# Patient Record
Sex: Female | Born: 1955 | Race: Black or African American | Hispanic: No | Smoking: Former smoker
Health system: Southern US, Community
[De-identification: ages and names within clinical notes are randomized; demographics above are authoritative.]

## PROBLEM LIST (undated history)

## (undated) DIAGNOSIS — T7840XA Allergy, unspecified, initial encounter: Secondary | ICD-10-CM

## (undated) DIAGNOSIS — I10 Essential (primary) hypertension: Secondary | ICD-10-CM

## (undated) HISTORY — DX: Allergy, unspecified, initial encounter: T78.40XA

## (undated) HISTORY — PX: OTHER SURGICAL HISTORY: SHX169

## (undated) HISTORY — DX: Essential (primary) hypertension: I10

---

## 1999-01-09 ENCOUNTER — Encounter: Payer: Self-pay | Admitting: Gastroenterology

## 1999-01-09 ENCOUNTER — Ambulatory Visit (HOSPITAL_COMMUNITY): Admission: RE | Admit: 1999-01-09 | Discharge: 1999-01-09 | Payer: Self-pay | Admitting: Gastroenterology

## 1999-03-16 ENCOUNTER — Ambulatory Visit (HOSPITAL_COMMUNITY): Admission: RE | Admit: 1999-03-16 | Discharge: 1999-03-16 | Payer: Self-pay | Admitting: Internal Medicine

## 1999-03-17 ENCOUNTER — Encounter: Payer: Self-pay | Admitting: Internal Medicine

## 2000-12-31 ENCOUNTER — Ambulatory Visit (HOSPITAL_COMMUNITY): Admission: RE | Admit: 2000-12-31 | Discharge: 2000-12-31 | Payer: Self-pay | Admitting: Urology

## 2000-12-31 ENCOUNTER — Encounter: Payer: Self-pay | Admitting: Urology

## 2001-02-14 ENCOUNTER — Other Ambulatory Visit: Admission: RE | Admit: 2001-02-14 | Discharge: 2001-02-14 | Payer: Self-pay | Admitting: Obstetrics and Gynecology

## 2002-02-16 ENCOUNTER — Other Ambulatory Visit: Admission: RE | Admit: 2002-02-16 | Discharge: 2002-02-16 | Payer: Self-pay | Admitting: Gynecology

## 2003-01-18 ENCOUNTER — Encounter: Payer: Self-pay | Admitting: Emergency Medicine

## 2003-01-18 ENCOUNTER — Emergency Department (HOSPITAL_COMMUNITY): Admission: EM | Admit: 2003-01-18 | Discharge: 2003-01-18 | Payer: Self-pay | Admitting: Emergency Medicine

## 2005-10-29 HISTORY — PX: ABDOMINAL HYSTERECTOMY: SHX81

## 2006-09-01 ENCOUNTER — Emergency Department (HOSPITAL_COMMUNITY): Admission: EM | Admit: 2006-09-01 | Discharge: 2006-09-01 | Payer: Self-pay | Admitting: Emergency Medicine

## 2015-02-22 ENCOUNTER — Ambulatory Visit (INDEPENDENT_AMBULATORY_CARE_PROVIDER_SITE_OTHER): Payer: BLUE CROSS/BLUE SHIELD

## 2015-02-22 ENCOUNTER — Ambulatory Visit (INDEPENDENT_AMBULATORY_CARE_PROVIDER_SITE_OTHER): Payer: BLUE CROSS/BLUE SHIELD | Admitting: Physician Assistant

## 2015-02-22 VITALS — BP 180/100 | HR 87 | Temp 98.3°F | Resp 18 | Ht 65.5 in | Wt 186.6 lb

## 2015-02-22 DIAGNOSIS — R062 Wheezing: Secondary | ICD-10-CM | POA: Insufficient documentation

## 2015-02-22 DIAGNOSIS — B182 Chronic viral hepatitis C: Secondary | ICD-10-CM | POA: Insufficient documentation

## 2015-02-22 DIAGNOSIS — J209 Acute bronchitis, unspecified: Secondary | ICD-10-CM

## 2015-02-22 DIAGNOSIS — R0981 Nasal congestion: Secondary | ICD-10-CM

## 2015-02-22 DIAGNOSIS — R05 Cough: Secondary | ICD-10-CM

## 2015-02-22 DIAGNOSIS — R058 Other specified cough: Secondary | ICD-10-CM

## 2015-02-22 DIAGNOSIS — I10 Essential (primary) hypertension: Secondary | ICD-10-CM | POA: Insufficient documentation

## 2015-02-22 LAB — POCT CBC
Granulocyte percent: 46.6 %G (ref 37–80)
HCT, POC: 48.5 % — AB (ref 37.7–47.9)
Hemoglobin: 15.9 g/dL (ref 12.2–16.2)
Lymph, poc: 2.7 (ref 0.6–3.4)
MCH, POC: 28.8 pg (ref 27–31.2)
MCHC: 32.7 g/dL (ref 31.8–35.4)
MCV: 88 fL (ref 80–97)
MID (cbc): 0.5 (ref 0–0.9)
MPV: 8.9 fL (ref 0–99.8)
POC Granulocyte: 2.7 (ref 2–6.9)
POC LYMPH PERCENT: 45.5 %L (ref 10–50)
POC MID %: 7.9 %M (ref 0–12)
Platelet Count, POC: 204 10*3/uL (ref 142–424)
RBC: 5.51 M/uL — AB (ref 4.04–5.48)
RDW, POC: 14.2 %
WBC: 5.9 10*3/uL (ref 4.6–10.2)

## 2015-02-22 LAB — COMPREHENSIVE METABOLIC PANEL
ALT: 35 U/L (ref 0–35)
AST: 32 U/L (ref 0–37)
Albumin: 4.1 g/dL (ref 3.5–5.2)
Alkaline Phosphatase: 64 U/L (ref 39–117)
BUN: 11 mg/dL (ref 6–23)
CO2: 26 mEq/L (ref 19–32)
Calcium: 8.8 mg/dL (ref 8.4–10.5)
Chloride: 109 mEq/L (ref 96–112)
Creat: 0.74 mg/dL (ref 0.50–1.10)
Glucose, Bld: 107 mg/dL — ABNORMAL HIGH (ref 70–99)
Potassium: 3.8 mEq/L (ref 3.5–5.3)
Sodium: 145 mEq/L (ref 135–145)
Total Bilirubin: 0.6 mg/dL (ref 0.2–1.2)
Total Protein: 7.9 g/dL (ref 6.0–8.3)

## 2015-02-22 LAB — TSH: TSH: 1.49 u[IU]/mL (ref 0.350–4.500)

## 2015-02-22 MED ORDER — AZITHROMYCIN 250 MG PO TABS: ORAL_TABLET | ORAL | Status: DC

## 2015-02-22 MED ORDER — BECLOMETHASONE DIPROPIONATE 40 MCG/ACT IN AERS: 1.0000 | INHALATION_SPRAY | Freq: Two times a day (BID) | RESPIRATORY_TRACT | Status: DC

## 2015-02-22 MED ORDER — IPRATROPIUM BROMIDE 0.03 % NA SOLN: 2.0000 | Freq: Two times a day (BID) | NASAL | Status: DC

## 2015-02-22 MED ORDER — LISINOPRIL-HYDROCHLOROTHIAZIDE 10-12.5 MG PO TABS
1.0000 | ORAL_TABLET | Freq: Every day | ORAL | Status: DC
Start: 2015-02-22 — End: 2015-04-26

## 2015-02-22 MED ORDER — IPRATROPIUM BROMIDE 0.02 % IN SOLN
0.5000 mg | Freq: Once | RESPIRATORY_TRACT | Status: AC
  Administered 2015-02-22: 0.5 mg via RESPIRATORY_TRACT

## 2015-02-22 MED ORDER — ALBUTEROL SULFATE (2.5 MG/3ML) 0.083% IN NEBU
2.5000 mg | INHALATION_SOLUTION | Freq: Once | RESPIRATORY_TRACT | Status: AC
  Administered 2015-02-22: 2.5 mg via RESPIRATORY_TRACT

## 2015-02-22 MED ORDER — ALBUTEROL SULFATE HFA 108 (90 BASE) MCG/ACT IN AERS: 2.0000 | INHALATION_SPRAY | RESPIRATORY_TRACT | Status: DC | PRN

## 2015-02-22 NOTE — Patient Instructions (Signed)
Use qvar inhaler twice a day for next 1 month at least. May continue if helping. Rinse mouth each time after use. Use albuterol prn wheezing. atrovent for nasal congestion. Take BP medication daily in the mornings. Start back up with walking most days of the week. Return to see me in 2 months.

## 2015-02-22 NOTE — Progress Notes (Signed)
Subjective:    Patient ID: Jacqueline Thompson, female    DOB: 1956-05-02, 59 y.o.   MRN: [id number]  HPI  This is a 59 year old female who is presenting with cough and wheezing x 3 weeks. Cough is productive. Cough is less productive than initially. Pt reports she has been wheezing even before the cough began. Wheezing has increased since the cough began. Been wheezing for 2 years. Wheezing does not bother her - she is able to walk for exercise without any problems. She feels congested as well but no nasal discharge. Denies sinus pressure. No history of environmental allergies. No history of asthma. History of light tobacco use but quit 10 years ago. Not taking anything for current symptoms. No history of heartburn.  Blood pressure is elevated today to 180/100. She states " my BP has always been high". Duration of HTN for 10 years. She has been prescribed lisinopril in the past but does not take regularly - Takes once a month. She does not have a PCP currently. She denies CP, SOB, palpitations, headache, dizziness, N/V, LE edema.  Has a history of hep C.  Was seeing a doctor in [place] years ago and was prescribed injections to use at home but she never started. Wants to see someone again.  Walks 4-5 days a week in the mornings for 1.5 hours. Wants to lose weight.  Review of Systems  Constitutional: Negative for fever and chills.  HENT: Positive for congestion. Negative for ear pain, sinus pressure and sore throat.   Eyes: Negative for redness.  Respiratory: Positive for cough and wheezing. Negative for shortness of breath.   Cardiovascular: Negative for chest pain, palpitations and leg swelling.  Gastrointestinal: Negative for nausea, vomiting and abdominal pain.  Skin: Negative for rash.  Allergic/Immunologic: Negative for environmental allergies.  Neurological: Negative for dizziness, weakness, numbness and headaches.  Hematological: Negative for adenopathy.  Psychiatric/Behavioral:  Negative for sleep disturbance.   There are no active problems to display for this patient.  Prior to Admission medications   Not on File   No Known Allergies  Patient's social and family history were reviewed.     Objective:   Physical Exam  Constitutional: She is oriented to person, place, and time. She appears well-developed and well-nourished. No distress.  HENT:  Head: Normocephalic and atraumatic.  Right Ear: Hearing, external ear and ear canal normal. Tympanic membrane is retracted.  Left Ear: Hearing, external ear and ear canal normal. Tympanic membrane is retracted.  Nose: Nose normal.  Mouth/Throat: Uvula is midline, oropharynx is clear and moist and mucous membranes are normal.  Eyes: Conjunctivae and lids are normal. Right eye exhibits no discharge. Left eye exhibits no discharge. No scleral icterus.  Neck: Carotid bruit is not present.  Cardiovascular: Normal rate, regular rhythm, normal heart sounds and intact distal pulses.   No murmur heard. Pulses:      Carotid pulses are 3+ on the right side, and 3+ on the left side.      Radial pulses are 3+ on the right side, and 3+ on the left side.  Pulmonary/Chest: Effort normal. No respiratory distress. She has wheezes (expiratory). She has no rhonchi. She has no rales.  Musculoskeletal: Normal range of motion.  Lymphadenopathy:       Head (right side): No submental, no submandibular and no tonsillar adenopathy present.       Head (left side): No submental, no submandibular and no tonsillar adenopathy present.    She  has no cervical adenopathy.  Neurological: She is alert and oriented to person, place, and time.  Skin: Skin is warm, dry and intact. No lesion and no rash noted.  No LE edema  Psychiatric: She has a normal mood and affect. Her speech is normal and behavior is normal. Thought content normal.   BP 180/100 mmHg  Pulse 87  Temp(Src) 98.3 F (36.8 C) (Oral)  Resp 18  Ht 5' 5.5" (1.664 m)  Wt 186 lb 9.6 oz  (84.641 kg)  BMI 30.57 kg/m2  SpO2 96%  [facility] reading (PRIMARY) by  Dr. Ouida Sills: negative  Results for orders placed or performed in visit on 02/22/15  POCT CBC  Result Value Ref Range   WBC 5.9 4.6 - 10.2 K/uL   Lymph, poc 2.7 0.6 - 3.4   POC LYMPH PERCENT 45.5 10 - 50 %L   MID (cbc) 0.5 0 - 0.9   POC MID % 7.9 0 - 12 %M   POC Granulocyte 2.7 2 - 6.9   Granulocyte percent 46.6 37 - 80 %G   RBC 5.51 (A) 4.04 - 5.48 M/uL   Hemoglobin 15.9 12.2 - 16.2 g/dL   HCT, POC 48.5 (A) 37.7 - 47.9 %   MCV 88.0 80 - 97 fL   MCH, POC 28.8 27 - 31.2 pg   MCHC 32.7 31.8 - 35.4 g/dL   RDW, POC 14.2 %   Platelet Count, POC 204 142 - 424 K/uL   MPV 8.9 0 - 99.8 fL   Peak flow 320 before and after neb treatment. No change in wheezing on exam.    Assessment & Plan:  1. Wheezing 2. Productive cough 3. Nasal congestion 4. Acute bronchitis CXR negative. There was no change in peak flow before and after DuoNeb treatment. Gave prescription for albuterol and Qvar. Patient will use Qvar twice a day for next 2 months to see if it makes any difference in her wheezing.Z-Pak for acute bronchitis. Atrovent nasal spray for nasal congestion. She will return in 2 months for followup on her blood pressure. Will do PFTs at that time.  - DG Chest 2 View; Future - ipratropium (ATROVENT) nebulizer solution 0.5 mg; Take 2.5 mLs (0.5 mg total) by nebulization once. - albuterol (PROVENTIL) (2.5 MG/3ML) 0.083% nebulizer solution 2.5 mg; Take 3 mLs (2.5 mg total) by nebulization once. - albuterol (PROVENTIL HFA;VENTOLIN HFA) 108 (90 BASE) MCG/ACT inhaler; Inhale 2 puffs into the lungs every 4 (four) hours as needed for wheezing or shortness of breath (cough, shortness of breath or wheezing.).  Dispense: 1 Inhaler; Refill: 12 - beclomethasone (QVAR) 40 MCG/ACT inhaler; Inhale 1 puff into the lungs 2 (two) times daily.  Dispense: 1 Inhaler; Refill: 12 - azithromycin (ZITHROMAX) 250 MG tablet; Take 2 tabs PO x 1 dose, then 1  tab PO QD x 4 days  Dispense: 6 tablet; Refill: 0 - ipratropium (ATROVENT) 0.03 % nasal spray; Place 2 sprays into both nostrils 2 (two) times daily.  Dispense: 30 mL; Refill: 0  5. Essential hypertension Pt will start taking lisinopril-HCTZ QAM. Discussed the risks of long-term HTN and she understands. CMP, TSH pending. She has a blood pressure monitor at home. She was instructed to take blood pressure 2-3 times a week. She will return for follow up in 2 months. - POCT CBC - Comprehensive metabolic panel - TSH - lisinopril-hydrochlorothiazide (PRINZIDE,ZESTORETIC) 10-12.5 MG per tablet; Take 1 tablet by mouth daily.  Dispense: 90 tablet; Refill: 1  6. Chronic hepatitis C -  Hepatitis C RNA quantitative - AMB referral to hepatitis C clinic    Benjaman Pott. Drenda Freeze, MHS [facility]  02/22/2015

## 2015-02-23 LAB — HEPATITIS C RNA QUANTITATIVE: HCV Quantitative: NOT DETECTED IU/mL (ref <15)

## 2015-02-26 NOTE — Progress Notes (Signed)
  Medical screening examination/treatment/procedure(s) were performed by non-physician practitioner and as supervising physician I was immediately available for consultation/collaboration.     

## 2015-04-26 ENCOUNTER — Ambulatory Visit (INDEPENDENT_AMBULATORY_CARE_PROVIDER_SITE_OTHER): Payer: BLUE CROSS/BLUE SHIELD | Admitting: Physician Assistant

## 2015-04-26 ENCOUNTER — Other Ambulatory Visit: Payer: Self-pay

## 2015-04-26 ENCOUNTER — Encounter: Payer: Self-pay | Admitting: Physician Assistant

## 2015-04-26 VITALS — BP 213/110 | HR 85 | Temp 98.4°F | Resp 16 | Ht 65.5 in | Wt 187.2 lb

## 2015-04-26 DIAGNOSIS — R768 Other specified abnormal immunological findings in serum: Secondary | ICD-10-CM | POA: Insufficient documentation

## 2015-04-26 DIAGNOSIS — I10 Essential (primary) hypertension: Secondary | ICD-10-CM | POA: Diagnosis not present

## 2015-04-26 DIAGNOSIS — Z8619 Personal history of other infectious and parasitic diseases: Secondary | ICD-10-CM

## 2015-04-26 DIAGNOSIS — Z13220 Encounter for screening for lipoid disorders: Secondary | ICD-10-CM

## 2015-04-26 DIAGNOSIS — R062 Wheezing: Secondary | ICD-10-CM | POA: Diagnosis not present

## 2015-04-26 DIAGNOSIS — Z23 Encounter for immunization: Secondary | ICD-10-CM

## 2015-04-26 DIAGNOSIS — J309 Allergic rhinitis, unspecified: Secondary | ICD-10-CM

## 2015-04-26 DIAGNOSIS — Z1231 Encounter for screening mammogram for malignant neoplasm of breast: Secondary | ICD-10-CM

## 2015-04-26 DIAGNOSIS — Z131 Encounter for screening for diabetes mellitus: Secondary | ICD-10-CM | POA: Diagnosis not present

## 2015-04-26 DIAGNOSIS — Z1239 Encounter for other screening for malignant neoplasm of breast: Secondary | ICD-10-CM | POA: Diagnosis not present

## 2015-04-26 LAB — CBC
HCT: 45 % (ref 36.0–46.0)
Hemoglobin: 15.4 g/dL — ABNORMAL HIGH (ref 12.0–15.0)
MCH: 29.8 pg (ref 26.0–34.0)
MCHC: 34.2 g/dL (ref 30.0–36.0)
MCV: 87.2 fL (ref 78.0–100.0)
MPV: 10.6 fL (ref 8.6–12.4)
PLATELETS: 271 10*3/uL (ref 150–400)
RBC: 5.16 MIL/uL — AB (ref 3.87–5.11)
RDW: 15.2 % (ref 11.5–15.5)
WBC: 7.3 10*3/uL (ref 4.0–10.5)

## 2015-04-26 LAB — HEMOGLOBIN A1C
Hgb A1c MFr Bld: 5.8 % — ABNORMAL HIGH (ref <5.7)
MEAN PLASMA GLUCOSE: 120 mg/dL — AB (ref <117)

## 2015-04-26 LAB — LIPID PANEL
CHOL/HDL RATIO: 4.3 ratio
Cholesterol: 186 mg/dL (ref 0–200)
HDL: 43 mg/dL — ABNORMAL LOW (ref >=46)
LDL CALC: 122 mg/dL — AB (ref 0–99)
Triglycerides: 106 mg/dL (ref <150)
VLDL: 21 mg/dL (ref 0–40)

## 2015-04-26 MED ORDER — FLUTICASONE PROPIONATE 50 MCG/ACT NA SUSP: 2.0000 | Freq: Every day | NASAL | Status: DC

## 2015-04-26 MED ORDER — ZOSTER VACCINE LIVE 19400 UNT/0.65ML ~~LOC~~ SOLR: 0.6500 mL | Freq: Once | SUBCUTANEOUS | Status: DC

## 2015-04-26 MED ORDER — LISINOPRIL-HYDROCHLOROTHIAZIDE 20-25 MG PO TABS: 1.0000 | ORAL_TABLET | Freq: Every day | ORAL | Status: DC

## 2015-04-26 MED ORDER — BLOOD PRESSURE MONITOR/ARM DEVI: 1.0000 | Freq: Once | Status: DC

## 2015-04-26 NOTE — Progress Notes (Signed)
[facility] [address], [place] [address] [phone]  Date:  04/26/2015   Name:  Jacqueline Thompson   DOB:  November 26, 1955   MRN:  [id number]  PCP:  No primary care provider on file.    Chief Complaint: Follow-up   History of Present Illness:  This is a 59 y.o. female with PMH HTN who is presenting for follow up HTN. I met patient for the first time 2 months ago. At that time she was being seen for bronchitis but was also noted to have elevated BP to 180/100. She had been on lisinopril in the past but it had been years since she had taken anything. She agreed to start on medication again. She was prescribed lisinopril 10 mg - HCTZ 12.5 mg. She is reporting she did not take her meds today but generally takes everyday. She checked her BP once in the past 2 months with a wrist cuff and was 180s/100s. Today BP 213/110. She is asymptomatic - she denies CP, SOB, palpitations, headache, dizziness, visual disturbance. Pt has not been exercising like she was planning to. She has gained 1 pound since last visit. She has done well with her diet, eating lots of salad and trying to eat less processed foods.  At last visit wheezing was noted on lung exam. Pt reported she had been wheezing for the past 2 years. Radiograph at that time negative. I prescribed qvar to use BID and albuterol prn. Pt states wheezing is getting better. Been compliant with qvar. Uses albuterol inhaler once a week. Using nasal spray as needed for nasal congestion. She is a former smoker - quit 10 years ago.  Last visit pt wanted to be referred to hep C clinic as she had been dx'd with hep c in the past but never had treatment. Lab testing at last visit revealed she had cleared the infection.  Colonoscopy 2010 It has been several years since her last mammogram Last pap 2007 - had hysterectomy for fibroids since then. Still has ovaries. Never had shingles vaccine  Review of Systems:  Review of Systems See  HPI  Patient Active Problem List   Diagnosis Date Noted  . History of hepatitis C 04/26/2015  . Wheezing 02/22/2015  . Essential hypertension 02/22/2015    Prior to Admission medications   Medication Sig Start Date End Date Taking? Authorizing Provider  albuterol (PROVENTIL HFA;VENTOLIN HFA) 108 (90 BASE) MCG/ACT inhaler Inhale 2 puffs into the lungs every 4 (four) hours as needed for wheezing or shortness of breath (cough, shortness of breath or wheezing.). 02/22/15  Yes Bennett Scrape V, PA-C  beclomethasone (QVAR) 40 MCG/ACT inhaler Inhale 1 puff into the lungs 2 (two) times daily. 02/22/15  Yes Bennett Scrape V, PA-C  ipratropium (ATROVENT) 0.03 % nasal spray Place 2 sprays into both nostrils 2 (two) times daily. 02/22/15  Yes Bennett Scrape V, PA-C  lisinopril-hydrochlorothiazide (PRINZIDE,ZESTORETIC) 10-12.5 MG per tablet Take 1 tablet by mouth daily. 02/22/15  Yes Ezekiel Slocumb, PA-C    No Known Allergies  Past Surgical History  Procedure Laterality Date  . Abdominal hysterectomy      History  Substance Use Topics  . Smoking status: Never Smoker   . Smokeless tobacco: Not on file  . Alcohol Use: Not on file    History reviewed. No pertinent family history.  Medication list has been reviewed and updated.  Physical Examination:  Physical Exam  Constitutional: She is oriented to person, place, and  time. She appears well-developed and well-nourished. No distress.  HENT:  Head: Normocephalic and atraumatic.  Right Ear: Hearing normal.  Left Ear: Hearing normal.  Nose: Nose normal.  Eyes: Conjunctivae and lids are normal. Right eye exhibits no discharge. Left eye exhibits no discharge. No scleral icterus.  Neck: Carotid bruit is not present.  Cardiovascular: Normal rate, regular rhythm and normal heart sounds.   No murmur heard. Pulses:      Carotid pulses are 3+ on the right side, and 3+ on the left side.      Radial pulses are 3+ on the right side, and 3+ on the left side.   Pulses bounding  Pulmonary/Chest: Effort normal and breath sounds normal. No respiratory distress. She has no wheezes. She has no rhonchi. She has no rales.  Musculoskeletal: Normal range of motion.  Lymphadenopathy:       Head (right side): No submental, no submandibular and no tonsillar adenopathy present.       Head (left side): No submental, no submandibular and no tonsillar adenopathy present.    She has no cervical adenopathy.  Neurological: She is alert and oriented to person, place, and time. Gait normal.  Skin: Skin is warm, dry and intact. No lesion and no rash noted.  Psychiatric: She has a normal mood and affect. Her speech is normal and behavior is normal. Thought content normal.   BP 213/110 mmHg  Pulse 85  Temp(Src) 98.4 F (36.9 C) (Oral)  Resp 16  Ht 5' 5.5" (1.664 m)  Wt 187 lb 3.2 oz (84.913 kg)  BMI 30.67 kg/m2  SpO2 98%  BP recheck same as above.  Assessment and Plan:  1. Essential hypertension BP very elevated to 213/110. Pt did not take medications today. Doubled dose of lisinopril and HCTZ. She will pick up today and start taking. Sent new BP monitor and cuff to pharmacy. She is going to start taking BP once a day. She will send readings to me in 2-3 weeks and we will determine if more changes need to be made.  - CBC - lisinopril-hydrochlorothiazide (PRINZIDE,ZESTORETIC) 20-25 MG per tablet; Take 1 tablet by mouth daily.  Dispense: 90 tablet; Refill: 1 - Blood Pressure Monitoring (BLOOD PRESSURE MONITOR/ARM) DEVI; 1 Device by Does not apply route once.  Dispense: 1 Device; Refill: 0  2. Screening for diabetes mellitus - Hemoglobin A1c  3. Lipid screening - Lipid panel  4. Breast cancer screening - MM Digital Screening; Future  5. Need for shingles vaccine - zoster vaccine live, PF, (ZOSTAVAX) 03833 UNT/0.65ML injection; Inject 19,400 Units into the skin once.  Dispense: 0.65 mL; Refill: 0  6. History of hepatitis C Naturally cleared infection per  lab testing 2 months ago.  7. Allergic rhinitis, unspecified allergic rhinitis type Stop atrovent, start flonase QD. - fluticasone (FLONASE) 50 MCG/ACT nasal spray; Place 2 sprays into both nostrils daily.  Dispense: 16 g; Refill: 12  8. Wheezing Much improved on lung exam. Continue BID qvar and prn albuterol.   Benjaman Pott Drenda Freeze, MHS [facility]  04/27/2015

## 2015-04-26 NOTE — Patient Instructions (Signed)
Start using flonase nasal spray once a day. This should help prevent nasal congestion. Continue with twice a day qvar inhaler and as needed albuterol inhaler. I will call you with the results of your lab tests today. Start taking new blood pressure pill once a day in the mornings. Buy new BP monitor with arm cuff and take BP once a day. In 3-4 weeks, send me a mychart message with your readings so we can determine if you need any changes. You will get a phone call to make appointment for mammogram. Take prescription for shingles vaccine to pharmacy and get it there. Return for follow up in 3 months.

## 2015-04-27 ENCOUNTER — Other Ambulatory Visit: Payer: Self-pay

## 2015-08-02 ENCOUNTER — Ambulatory Visit (INDEPENDENT_AMBULATORY_CARE_PROVIDER_SITE_OTHER): Payer: BLUE CROSS/BLUE SHIELD | Admitting: Physician Assistant

## 2015-08-02 ENCOUNTER — Encounter: Payer: Self-pay | Admitting: Physician Assistant

## 2015-08-02 VITALS — BP 180/114 | HR 81 | Temp 98.3°F | Resp 16 | Wt 184.0 lb

## 2015-08-02 DIAGNOSIS — I10 Essential (primary) hypertension: Secondary | ICD-10-CM

## 2015-08-02 DIAGNOSIS — Z23 Encounter for immunization: Secondary | ICD-10-CM

## 2015-08-02 DIAGNOSIS — Z1239 Encounter for other screening for malignant neoplasm of breast: Secondary | ICD-10-CM | POA: Diagnosis not present

## 2015-08-02 MED ORDER — AMLODIPINE BESYLATE 5 MG PO TABS: ORAL_TABLET | ORAL | Status: DC

## 2015-08-02 MED ORDER — BLOOD PRESSURE MONITOR/ARM DEVI: 1.0000 | Freq: Once | Status: DC

## 2015-08-02 NOTE — Progress Notes (Signed)
[facility] [address] [place] [address] [phone]  Date:  08/02/2015   Name:  Jacqueline Thompson   DOB:  [birth year]   MRN:  [id number]  PCP:  No primary care provider on file.    Chief Complaint: Hypertension   History of Present Illness:  This is a 59 y.o. female with PMH HTN who is presenting for follow up HTN. I last saw pt 6/28 -- at that time her BP was very elevated to 213/110 but was asymptomatic. I doubled her lisinopril and HCTZ dose. She was to contact me by mychart 2 weeks later with BP readings. She never did this and I was unable to contact her by phone. She is here now stating she is feeling well. She checked her BP once in the past 3 months with a wrist cuff and was 180s/100s. She thinks her wrist cuff is not working well. Last visit I send an arm cuff and monitor to her pharmacy but she states she never picked up. She states she has not been exercising. She states "I'm lazy". She has not been watching the salt in her diet. She admits to eating a lot of french fries and states "my husband is heavy handed with the salt". She denies CP, SOB, headache, dizziness, blurred vision, LE edema. She states she takes her medicine every day.  Last visit she was given rx for zostavax for the 2nd time - did not get. Order mammogram - referrals was unable to reach as phone numbers listed are not correct.  Review of Systems:  Review of Systems See HPI  Patient Active Problem List   Diagnosis Date Noted  . History of hepatitis C 04/26/2015  . Wheezing 02/22/2015  . Essential hypertension 02/22/2015    Prior to Admission medications   Medication Sig Start Date End Date Taking? Authorizing Provider  albuterol (PROVENTIL HFA;VENTOLIN HFA) 108 (90 BASE) MCG/ACT inhaler Inhale 2 puffs into the lungs every 4 (four) hours as needed for wheezing or shortness of breath (cough, shortness of breath or wheezing.). 02/22/15  Yes Bennett Scrape V, PA-C  beclomethasone (QVAR)  40 MCG/ACT inhaler Inhale 1 puff into the lungs 2 (two) times daily. 02/22/15  Yes Bennett Scrape V, PA-C  Blood Pressure Monitoring (BLOOD PRESSURE MONITOR/ARM) DEVI 1 Device by Does not apply route once. 04/26/15  Yes Bennett Scrape V, PA-C  fluticasone (FLONASE) 50 MCG/ACT nasal spray Place 2 sprays into both nostrils daily. 04/26/15  Yes Bennett Scrape V, PA-C  lisinopril-hydrochlorothiazide (PRINZIDE,ZESTORETIC) 20-25 MG per tablet Take 1 tablet by mouth daily. 04/26/15  Yes Bennett Scrape V, PA-C  zoster vaccine live, PF, (ZOSTAVAX) 81448 UNT/0.65ML injection Inject 19,400 Units into the skin once. 04/26/15  Yes Bennett Scrape V, PA-C  zoster vaccine live, PF, (ZOSTAVAX) 18563 UNT/0.65ML injection Inject 19,400 Units into the skin once. 04/26/15  Yes Ezekiel Slocumb, PA-C    Not on File  Past Surgical History  Procedure Laterality Date  . Abdominal hysterectomy      Social History  Substance Use Topics  . Smoking status: Never Smoker   . Smokeless tobacco: None  . Alcohol Use: None    History reviewed. No pertinent family history.  Medication list has been reviewed and updated.  Physical Examination:  Physical Exam  Constitutional: She is oriented to person, place, and time. She appears well-developed and well-nourished. No distress.  HENT:  Head: Normocephalic and atraumatic.  Right Ear: Hearing normal.  Left Ear: Hearing  normal.  Nose: Nose normal.  Eyes: Conjunctivae and lids are normal. Right eye exhibits no discharge. Left eye exhibits no discharge. No scleral icterus.  Neck: Trachea normal. Carotid bruit is not present. No thyromegaly present.  Cardiovascular: Normal rate, regular rhythm and normal heart sounds.   No murmur heard. Pulmonary/Chest: Effort normal and breath sounds normal. No respiratory distress. She has no wheezes. She has no rhonchi. She has no rales.  Musculoskeletal: Normal range of motion.  Lymphadenopathy:       Head (right side): No submental, no submandibular and  no tonsillar adenopathy present.       Head (left side): No submental, no submandibular and no tonsillar adenopathy present.    She has no cervical adenopathy.  Neurological: She is alert and oriented to person, place, and time.  Skin: Skin is warm, dry and intact. No lesion and no rash noted.  Psychiatric: She has a normal mood and affect. Her speech is normal and behavior is normal. Thought content normal.   BP 180/114 mmHg  Pulse 81  Temp(Src) 98.3 F (36.8 C) (Oral)  Resp 16  Wt 184 lb (83.462 kg)  Assessment and Plan:  1. Essential hypertension Pt's BP is just as high as when she was not taking anything for BP. Question compliance with meds. At this time she does not seem committed to lifestyle changes to help lower her BP. We discussed the importance of regular exercise and limiting salt in diet. Will add amlodipine to regimen. She will take 5 mg QD x 1 week then increase to 10 mg QD thereafter. Return in 4 weeks for follow up. - amLODipine (NORVASC) 5 MG tablet; Take 1 tab po QD for 1 week, then increase to 2 tab po QD thereafter.  Dispense: 60 tablet; Refill: 0 - Blood Pressure Monitoring (BLOOD PRESSURE MONITOR/ARM) DEVI; 1 Device by Does not apply route once.  Dispense: 1 Device; Refill: 0  2. Needs flu shot - Flu Vaccine QUAD 36+ mos IM  3. Breast cancer screening Updated phone numbers in system. Again ordered mammogram. - MM Digital Screening; Future   Benjaman Pott. Drenda Freeze, MHS [facility]  08/04/2015

## 2015-08-02 NOTE — Patient Instructions (Addendum)
Watch the salt in your diet, no more than 2.5 mg per day    Watch the white foods in your diet -- rice, potatoes, pasta, bread, sugary beverages, sweets    Go to the bathroom one extra time a day at work for exercise.    Try to start walking more outside of work.    Return to see me in 4 weeks.    You will get a phone call to make appt for mammogram  Why follow it? Research shows. . Those who follow the Mediterranean diet have a reduced risk of heart disease  . The diet is associated with a reduced incidence of Parkinson's and Alzheimer's diseases . People following the diet may have longer life expectancies and lower rates of chronic diseases  . The Dietary Guidelines for Americans recommends the Mediterranean diet as an eating plan to promote health and prevent disease  What Is the Mediterranean Diet?  . Healthy eating plan based on typical foods and recipes of Mediterranean-style cooking . The diet is primarily a plant based diet; these foods should make up a majority of meals   Starches - Plant based foods should make up a majority of meals - They are an important sources of vitamins, minerals, energy, antioxidants, and fiber - Choose whole grains, foods high in fiber and minimally processed items  - Typical grain sources include wheat, oats, barley, corn, brown rice, bulgar, farro, millet, polenta, couscous  - Various types of beans include chickpeas, lentils, fava beans, black beans, white beans   Fruits  Veggies - Large quantities of antioxidant rich fruits & veggies; 6 or more servings  - Vegetables can be eaten raw or lightly drizzled with oil and cooked  - Vegetables common to the traditional Mediterranean Diet include: artichokes, arugula, beets, broccoli, brussel sprouts, cabbage, carrots, celery, collard greens, cucumbers, eggplant, kale, leeks, lemons, lettuce, mushrooms, okra, onions, peas, peppers, potatoes, pumpkin, radishes, rutabaga, shallots, spinach, sweet potatoes,  turnips, zucchini - Fruits common to the Mediterranean Diet include: apples, apricots, avocados, cherries, clementines, dates, figs, grapefruits, grapes, melons, nectarines, oranges, peaches, pears, pomegranates, strawberries, tangerines  Fats - Replace butter and margarine with healthy oils, such as olive oil, canola oil, and tahini  - Limit nuts to no more than a handful a day  - Nuts include walnuts, almonds, pecans, pistachios, pine nuts  - Limit or avoid candied, honey roasted or heavily salted nuts - Olives are central to the [employer] - can be eaten whole or used in a variety of dishes   Meats Protein - Limiting red meat: no more than a few times a month - When eating red meat: choose lean cuts and keep the portion to the size of deck of cards - Eggs: approx. 0 to 4 times a week  - Fish and lean poultry: at least 2 a week  - Healthy protein sources include, chicken, [place], lean beef, lamb - Increase intake of seafood such as tuna, salmon, trout, mackerel, shrimp, scallops - Avoid or limit high fat processed meats such as sausage and bacon  Dairy - Include moderate amounts of low fat dairy products  - Focus on healthy dairy such as fat free yogurt, skim milk, low or reduced fat cheese - Limit dairy products higher in fat such as whole or 2% milk, cheese, ice cream  Alcohol - Moderate amounts of red wine is ok  - No more than 5 oz daily for women (all ages) and men older than age 65  -  No more than 10 oz of wine daily for men younger than 40  Other - Limit sweets and other desserts  - Use herbs and spices instead of salt to flavor foods  - Herbs and spices common to the traditional Mediterranean Diet include: basil, bay leaves, chives, cloves, cumin, fennel, garlic, lavender, marjoram, mint, oregano, parsley, pepper, rosemary, sage, savory, sumac, tarragon, thyme   It's not just a diet, it's a lifestyle:  . The Mediterranean diet includes lifestyle factors typical of those in  the region  . Foods, drinks and meals are best eaten with others and savored . Daily physical activity is important for overall good health . This could be strenuous exercise like running and aerobics . This could also be more leisurely activities such as walking, housework, yard-work, or taking the stairs . Moderation is the key; a balanced and healthy diet accommodates most foods and drinks . Consider portion sizes and frequency of consumption of certain foods   Meal Ideas & Options:  . Breakfast:  o Whole wheat toast or whole wheat English muffins with peanut butter & hard boiled egg o Steel cut oats topped with apples & cinnamon and skim milk  o Fresh fruit: banana, strawberries, melon, berries, peaches  o Smoothies: strawberries, bananas, greek yogurt, peanut butter o Low fat greek yogurt with blueberries and granola  o Egg white omelet with spinach and mushrooms o Breakfast couscous: whole wheat couscous, apricots, skim milk, cranberries  . Sandwiches:  o Hummus and grilled vegetables (peppers, zucchini, squash) on whole wheat bread   o Grilled chicken on whole wheat pita with lettuce, tomatoes, cucumbers or tzatziki  o Tuna salad on whole wheat bread: tuna salad made with greek yogurt, olives, red peppers, capers, green onions o Garlic rosemary lamb pita: lamb sauted with garlic, rosemary, salt & pepper; add lettuce, cucumber, greek yogurt to pita - flavor with lemon juice and black pepper  . Seafood:  o Mediterranean grilled salmon, seasoned with garlic, basil, parsley, lemon juice and black pepper o Shrimp, lemon, and spinach whole-grain pasta salad made with low fat greek yogurt  o Seared scallops with lemon orzo  o Seared tuna steaks seasoned salt, pepper, coriander topped with tomato mixture of olives, tomatoes, olive oil, minced garlic, parsley, green onions and cappers  . Meats:  o Herbed greek chicken salad with kalamata olives, cucumber, feta  o Red bell peppers stuffed  with spinach, bulgur, lean ground beef (or lentils) & topped with feta   o Kebabs: skewers of chicken, tomatoes, onions, zucchini, squash  o [place] burgers: made with red onions, mint, dill, lemon juice, feta cheese topped with roasted red peppers . Vegetarian o Cucumber salad: cucumbers, artichoke hearts, celery, red onion, feta cheese, tossed in olive oil & lemon juice  o Hummus and whole grain pita points with a greek salad (lettuce, tomato, feta, olives, cucumbers, red onion) o Lentil soup with celery, carrots made with vegetable broth, garlic, salt and pepper  o Tabouli salad: parsley, bulgur, mint, scallions, cucumbers, tomato, radishes, lemon juice, olive oil, salt and pepper.

## 2015-08-08 ENCOUNTER — Telehealth: Payer: Self-pay | Admitting: Family Medicine

## 2015-08-08 NOTE — Telephone Encounter (Signed)
LMOM OF PATIENT NEW TIME ON 09/06/15 AT 4:00

## 2015-08-09 ENCOUNTER — Other Ambulatory Visit: Payer: Self-pay

## 2015-08-09 DIAGNOSIS — Z1231 Encounter for screening mammogram for malignant neoplasm of breast: Secondary | ICD-10-CM

## 2015-09-06 ENCOUNTER — Encounter: Payer: Self-pay | Admitting: Physician Assistant

## 2015-09-06 ENCOUNTER — Ambulatory Visit (INDEPENDENT_AMBULATORY_CARE_PROVIDER_SITE_OTHER): Payer: BLUE CROSS/BLUE SHIELD | Admitting: Physician Assistant

## 2015-09-06 ENCOUNTER — Other Ambulatory Visit: Payer: Self-pay | Admitting: Physician Assistant

## 2015-09-06 VITALS — BP 130/78 | HR 102 | Temp 98.1°F | Resp 16 | Ht 65.5 in | Wt 180.0 lb

## 2015-09-06 DIAGNOSIS — I10 Essential (primary) hypertension: Secondary | ICD-10-CM | POA: Diagnosis not present

## 2015-09-06 MED ORDER — AMLODIPINE BESYLATE 10 MG PO TABS: 10.0000 mg | ORAL_TABLET | Freq: Every day | ORAL | Status: DC

## 2015-09-06 NOTE — Progress Notes (Signed)
[facility] [address] [place] [address] [phone]  Date:  09/06/2015   Name:  Jacqueline Thompson   DOB:  May 04, 1956   MRN:  [id number]  PCP:  No primary care provider on file.    Chief Complaint: Follow-up   History of Present Illness:  This is a 59 y.o. female with PMH HTN who is presenting for follow up blood pressure. Pt was started on BP medication 6 months ago. BP still uncontrolled after lisinopril 20 mg and HCTZ 25 mg. Last visit 08/02/2015 BP 180/114. Amlodipine was added. She took 5 mg for 1 week and then increased to 10 mg. Today BP on arrival 158/89 and came down to 130/78. She states she feels "lighter". She has been focusing and using stairs at work. She has cut down on red meat and french fries. She is cooking more at home and eating more fish, fruits and vegetables. She has lost 4 pounds in the past month. She bought a BP cuff but has not taken BP yet. She denies CP, SOB, dizziness, palps, leg swelling, vision change. No side effects to amlodipine.  Mammogram scheduled in November.  Review of Systems:  Review of Systems See HPI  Patient Active Problem List   Diagnosis Date Noted  . History of hepatitis C 04/26/2015  . Wheezing 02/22/2015  . Essential hypertension 02/22/2015    Prior to Admission medications   Medication Sig Start Date End Date Taking? Authorizing Provider  albuterol (PROVENTIL HFA;VENTOLIN HFA) 108 (90 BASE) MCG/ACT inhaler Inhale 2 puffs into the lungs every 4 (four) hours as needed for wheezing or shortness of breath (cough, shortness of breath or wheezing.). 02/22/15  Yes Bennett Scrape V, PA-C  amLODipine (NORVASC) 5 MG tablet Take 1 tab po QD for 1 week, then increase to 2 tab po QD thereafter. 08/02/15  Yes Bennett Scrape V, PA-C  beclomethasone (QVAR) 40 MCG/ACT inhaler Inhale 1 puff into the lungs 2 (two) times daily. 02/22/15  Yes Bennett Scrape V, PA-C  fluticasone (FLONASE) 50 MCG/ACT nasal spray Place 2 sprays into both  nostrils daily. 04/26/15  Yes Bennett Scrape V, PA-C  lisinopril-hydrochlorothiazide (PRINZIDE,ZESTORETIC) 20-25 MG per tablet Take 1 tablet by mouth daily. 04/26/15  Yes Ezekiel Slocumb, PA-C           No Known Allergies  Past Surgical History  Procedure Laterality Date  . Abdominal hysterectomy      Social History  Substance Use Topics  . Smoking status: Never Smoker   . Smokeless tobacco: None  . Alcohol Use: None    History reviewed. No pertinent family history.  Medication list has been reviewed and updated.  Physical Examination:  Physical Exam  Constitutional: She is oriented to person, place, and time. She appears well-developed and well-nourished. No distress.  HENT:  Head: Normocephalic and atraumatic.  Right Ear: Hearing normal.  Left Ear: Hearing normal.  Nose: Nose normal.  Eyes: Conjunctivae and lids are normal. Right eye exhibits no discharge. Left eye exhibits no discharge. No scleral icterus.  Cardiovascular: Normal rate, regular rhythm, normal heart sounds and normal pulses.   No murmur heard. Pulmonary/Chest: Effort normal. No respiratory distress. She has no wheezes. She has no rhonchi. She has no rales.  Musculoskeletal: Normal range of motion.  Neurological: She is alert and oriented to person, place, and time.  Skin: Skin is warm, dry and intact. No lesion and no rash noted.  Psychiatric: She has a normal mood and  affect. Her speech is normal and behavior is normal. Thought content normal.    BP 130/78 mmHg  Pulse 102  Temp(Src) 98.1 F (36.7 C) (Oral)  Resp 16  Ht 5' 5.5" (1.664 m)  Wt 180 lb (81.647 kg)  BMI 29.49 kg/m2  Assessment and Plan:  1. Essential hypertension BP is looking much better on the amlodipine. She will continue lisinopril, hctz and amlodipine. Congratulated on weight loss and healthier eating. Advised she take BP at least once a week if not 2-3 times and keep a record. She will return in 3 months for CPE and f/u. - amLODipine  (NORVASC) 10 MG tablet; Take 1 tablet (10 mg total) by mouth daily.  Dispense: 90 tablet; Refill: 3   Leanor Voris V. Drenda Freeze, MHS [facility]  09/06/2015

## 2015-09-06 NOTE — Patient Instructions (Signed)
Keep with exercise and diet. Take your BP at least once a week and keep a record. Return in 3 months for a complete physical.

## 2015-09-20 ENCOUNTER — Ambulatory Visit
Admission: RE | Admit: 2015-09-20 | Discharge: 2015-09-20 | Disposition: A | Discharge status: Home / Self Care | Payer: BLUE CROSS/BLUE SHIELD | Source: Ambulatory Visit | Attending: Physician Assistant | Admitting: Physician Assistant

## 2015-09-20 DIAGNOSIS — Z1231 Encounter for screening mammogram for malignant neoplasm of breast: Secondary | ICD-10-CM

## 2015-10-29 ENCOUNTER — Other Ambulatory Visit: Payer: Self-pay | Admitting: Physician Assistant

## 2015-11-12 ENCOUNTER — Telehealth: Payer: Self-pay | Admitting: Family Medicine

## 2015-11-12 NOTE — Telephone Encounter (Signed)
Left a message for patient to return call to reschedule appointment on 12/06/15.  Jacqueline Thompson is not going to be in clinic that day,.

## 2015-11-12 NOTE — Telephone Encounter (Signed)
lmom to call and reschedule appt with Tawni Pummel

## 2015-12-06 ENCOUNTER — Encounter: Payer: BLUE CROSS/BLUE SHIELD | Admitting: Physician Assistant

## 2015-12-13 ENCOUNTER — Ambulatory Visit (INDEPENDENT_AMBULATORY_CARE_PROVIDER_SITE_OTHER): Payer: BLUE CROSS/BLUE SHIELD | Admitting: Physician Assistant

## 2015-12-13 ENCOUNTER — Encounter: Payer: Self-pay | Admitting: Physician Assistant

## 2015-12-13 VITALS — BP 131/70 | HR 71 | Temp 98.4°F | Resp 16 | Ht 65.5 in | Wt 175.4 lb

## 2015-12-13 DIAGNOSIS — I10 Essential (primary) hypertension: Secondary | ICD-10-CM | POA: Diagnosis not present

## 2015-12-13 DIAGNOSIS — Z1272 Encounter for screening for malignant neoplasm of vagina: Secondary | ICD-10-CM | POA: Diagnosis not present

## 2015-12-13 DIAGNOSIS — Z124 Encounter for screening for malignant neoplasm of cervix: Secondary | ICD-10-CM | POA: Diagnosis not present

## 2015-12-13 DIAGNOSIS — Z131 Encounter for screening for diabetes mellitus: Secondary | ICD-10-CM

## 2015-12-13 DIAGNOSIS — Z0000 Encounter for general adult medical examination without abnormal findings: Secondary | ICD-10-CM | POA: Diagnosis not present

## 2015-12-13 DIAGNOSIS — Z23 Encounter for immunization: Secondary | ICD-10-CM | POA: Diagnosis not present

## 2015-12-13 DIAGNOSIS — E785 Hyperlipidemia, unspecified: Secondary | ICD-10-CM

## 2015-12-13 LAB — CBC
HEMATOCRIT: 44.6 % (ref 36.0–46.0)
HEMOGLOBIN: 15 g/dL (ref 12.0–15.0)
MCH: 29.7 pg (ref 26.0–34.0)
MCHC: 33.6 g/dL (ref 30.0–36.0)
MCV: 88.3 fL (ref 78.0–100.0)
MPV: 10.8 fL (ref 8.6–12.4)
Platelets: 335 10*3/uL (ref 150–400)
RBC: 5.05 MIL/uL (ref 3.87–5.11)
RDW: 13.7 % (ref 11.5–15.5)
WBC: 6.2 10*3/uL (ref 4.0–10.5)

## 2015-12-13 LAB — COMPREHENSIVE METABOLIC PANEL
ALBUMIN: 3.7 g/dL (ref 3.6–5.1)
ALK PHOS: 75 U/L (ref 33–130)
ALT: 19 U/L (ref 6–29)
AST: 21 U/L (ref 10–35)
BILIRUBIN TOTAL: 0.4 mg/dL (ref 0.2–1.2)
BUN: 13 mg/dL (ref 7–25)
CALCIUM: 9.6 mg/dL (ref 8.6–10.4)
CO2: 30 mmol/L (ref 20–31)
Chloride: 102 mmol/L (ref 98–110)
Creat: 0.74 mg/dL (ref 0.50–0.99)
Glucose, Bld: 82 mg/dL (ref 65–99)
Potassium: 3.6 mmol/L (ref 3.5–5.3)
Sodium: 142 mmol/L (ref 135–146)
Total Protein: 7.3 g/dL (ref 6.1–8.1)

## 2015-12-13 LAB — HEMOGLOBIN A1C
Hgb A1c MFr Bld: 5.7 % — ABNORMAL HIGH (ref <5.7)
Mean Plasma Glucose: 117 mg/dL — ABNORMAL HIGH (ref <117)

## 2015-12-13 LAB — LIPID PANEL
CHOLESTEROL: 172 mg/dL (ref 125–200)
HDL: 40 mg/dL — AB (ref >=46)
LDL Cholesterol: 111 mg/dL (ref <130)
TRIGLYCERIDES: 103 mg/dL (ref <150)
Total CHOL/HDL Ratio: 4.3 Ratio (ref <=5.0)
VLDL: 21 mg/dL (ref <30)

## 2015-12-13 MED ORDER — ZOSTER VACCINE LIVE 19400 UNT/0.65ML ~~LOC~~ SOLR: 0.6500 mL | Freq: Once | SUBCUTANEOUS | Status: DC

## 2015-12-13 MED ORDER — LISINOPRIL-HYDROCHLOROTHIAZIDE 20-25 MG PO TABS: ORAL_TABLET | ORAL | Status: DC

## 2015-12-13 NOTE — Progress Notes (Signed)
[facility] [address], [place] [address] [phone]  Date:  12/13/2015   Name:  Jacqueline Thompson   DOB:  03/28/1956   MRN:  [id number]  PCP:  No primary care provider on file.    Chief Complaint: Annual Exam   History of Present Illness:  This is a 60 y.o. female with PMH HTN, hx hep C that spontaneously cleared who is presenting for CPE.  HTN - doing well on current regimen. Lisinopril 20-hctz 25 and amlodipine 5 mg. She is walking stairs at work for exercise. She has lost 10 pounds in past 6 months. Doing well with limiting diet. She is using a salt substitute.  Complaints: no Last pap: can't recall last pap. Doesn't think she has ever had abnormals. She had a laparoscopic procedure done in October of 2007 for menorrhagia. States she had fibroids. She is unsure if she has a uterus or cervix. She states she is pretty sure she has ovaries. Sexual history: sexually active with husband. Does not want STD testing today. Immunizations: flu 10/16, tdap 1/15. Unsure whether she wants zostavax. Dentist: 4 years ago. Eye: last had eye appt 10/16 Fam hx: HTN. CAD and DM in mother, dad with prostate cancer.  Tobacco/alcohol/substance use: no/2 drinks a week/no Mammogram: 09/20/15 Colonoscopy: colonoscopy 2010 Sleeps well, mood is good.   Review of Systems:  Review of Systems  Constitutional: Negative.   HENT: Negative.   Eyes: Negative.   Respiratory: Negative.   Cardiovascular: Negative.   Gastrointestinal: Negative.   Endocrine: Negative.   Genitourinary: Negative.   Musculoskeletal: Negative.   Skin: Negative.   Allergic/Immunologic: Negative.   Neurological: Negative.   Hematological: Negative.   Psychiatric/Behavioral: Negative.     Patient Active Problem List   Diagnosis Date Noted  . History of hepatitis C 04/26/2015  . Wheezing 02/22/2015  . Essential hypertension 02/22/2015    Prior to Admission medications   Medication Sig Start  Date End Date Taking? Authorizing Provider  albuterol (PROVENTIL HFA;VENTOLIN HFA) 108 (90 BASE) MCG/ACT inhaler Inhale 2 puffs into the lungs every 4 (four) hours as needed for wheezing or shortness of breath (cough, shortness of breath or wheezing.). 02/22/15  Yes Bennett Scrape V, PA-C  amLODipine (NORVASC) 10 MG tablet Take 1 tablet (10 mg total) by mouth daily. 09/06/15  Yes Bennett Scrape V, PA-C  beclomethasone (QVAR) 40 MCG/ACT inhaler Inhale 1 puff into the lungs 2 (two) times daily. 02/22/15  Yes Bennett Scrape V, PA-C  fluticasone (FLONASE) 50 MCG/ACT nasal spray Place 2 sprays into both nostrils daily. 04/26/15  Yes Bennett Scrape V, PA-C  lisinopril-hydrochlorothiazide (PRINZIDE,ZESTORETIC) 20-25 MG tablet TAKE 1 TABLET BY MOUTH DAILY 10/30/15  Yes Bennett Scrape V, PA-C  Blood Pressure Monitoring (BLOOD PRESSURE MONITOR/ARM) DEVI 1 Device by Does not apply route once. 08/02/15   Ezekiel Slocumb, PA-C    No Known Allergies  Past Surgical History  Procedure Laterality Date  . Abdominal hysterectomy  2007    Social History  Substance Use Topics  . Smoking status: Former Smoker -- 10 years    Quit date: 11/29/1997  . Smokeless tobacco: None     Comment: smoked a pack per week  . Alcohol Use: None    Family History  Problem Relation Age of Onset  . Heart disease Mother   . Cancer Father     colon,prostate and spine    Medication list has been reviewed and updated.  Physical Examination:  Physical Exam  Constitutional: She is oriented to person, place, and time.  HENT:  Head: Normocephalic and atraumatic.  Right Ear: Hearing, external ear and ear canal normal. Tympanic membrane is retracted.  Left Ear: Hearing, external ear and ear canal normal. Tympanic membrane is retracted.  Nose: Mucosal edema (dried blood on mucosa) present.  Mouth/Throat: Uvula is midline, oropharynx is clear and moist and mucous membranes are normal.  Eyes: Conjunctivae, EOM and lids are normal. Right eye exhibits  no discharge. Left eye exhibits no discharge. No scleral icterus.  Neck: Trachea normal. Carotid bruit is not present. No thyromegaly present.  Cardiovascular: Normal rate, regular rhythm, normal heart sounds, intact distal pulses and normal pulses.   No murmur heard. Pulmonary/Chest: Effort normal and breath sounds normal. She has no wheezes. She has no rhonchi. She has no rales. Right breast exhibits no inverted nipple, no mass, no nipple discharge, no skin change and no tenderness. Left breast exhibits no inverted nipple, no mass, no nipple discharge, no skin change and no tenderness. Breasts are symmetrical.  Scar through left nipple  Abdominal: Soft. Normal appearance and bowel sounds are normal. She exhibits no abdominal bruit. There is no tenderness.  Genitourinary: Vagina normal. There is no lesion on the right labia. There is no lesion on the left labia. Right adnexum displays no tenderness and no fullness. Left adnexum displays no tenderness and no fullness. No vaginal discharge found.  Cervix and uterus absent  Musculoskeletal: Normal range of motion.  Lymphadenopathy:       Head (right side): No submental, no submandibular and no tonsillar adenopathy present.       Head (left side): No submental, no submandibular and no tonsillar adenopathy present.    She has no cervical adenopathy.    She has no axillary adenopathy.       Right: No supraclavicular adenopathy present.       Left: No supraclavicular adenopathy present.  Neurological: She is alert and oriented to person, place, and time. She has normal strength and normal reflexes. No cranial nerve deficit or sensory deficit. Coordination and gait normal.  Skin: Skin is warm, dry and intact. No lesion and no rash noted.  Psychiatric: She has a normal mood and affect. Her speech is normal and behavior is normal. Thought content normal.   BP 131/70 mmHg  Pulse 71  Temp(Src) 98.4 F (36.9 C) (Oral)  Resp 16  Ht 5' 5.5" (1.664 m)   Wt 175 lb 6.4 oz (79.561 kg)  BMI 28.73 kg/m2  SpO2 97%  Assessment and Plan:  1. Annual physical exam Encouraged to get dental appt. Get shingles vaccine. Otherwise up to date on preventative screening. - CBC  2. Screening for vaginal cancer If negative, never needs pap again. - Pap IG, CT/NG w/ reflex HPV when ASC-U  3. Essential hypertension Controlled on current regimen. Continue. Return in 6 months for follow up. - Comprehensive metabolic panel - lisinopril-hydrochlorothiazide (PRINZIDE,ZESTORETIC) 20-25 MG tablet; TAKE 1 TABLET BY MOUTH DAILY  Dispense: 90 tablet; Refill: 1  4. HLD (hyperlipidemia) - Lipid panel  5. Need for shingles vaccine - zoster vaccine live, PF, (ZOSTAVAX) 16109 UNT/0.65ML injection; Inject 19,400 Units into the skin once.  Dispense: 0.65 mL; Refill: 0  6. Screening for diabetes mellitus - Hemoglobin A1c   Benjaman Pott. Drenda Freeze, MHS [facility]  12/13/2015

## 2015-12-13 NOTE — Patient Instructions (Addendum)
Make appt with dentist Get shingles vaccine at [facility] or [facility] Continue with diet and exercise. No need for pap again if pap normal Return in 6 months for follow up.

## 2015-12-15 LAB — PAP IG, CT-NG, RFX HPV ASCU
Chlamydia Probe Amp: NOT DETECTED
GC PROBE AMP: NOT DETECTED

## 2016-01-27 ENCOUNTER — Other Ambulatory Visit: Payer: Self-pay | Admitting: Physician Assistant

## 2016-05-29 ENCOUNTER — Ambulatory Visit: Payer: BLUE CROSS/BLUE SHIELD | Admitting: Physician Assistant

## 2016-07-16 ENCOUNTER — Other Ambulatory Visit: Payer: Self-pay | Admitting: Physician Assistant

## 2016-07-16 DIAGNOSIS — I10 Essential (primary) hypertension: Secondary | ICD-10-CM

## 2016-08-22 ENCOUNTER — Other Ambulatory Visit: Payer: Self-pay | Admitting: Physician Assistant

## 2016-08-22 DIAGNOSIS — J309 Allergic rhinitis, unspecified: Secondary | ICD-10-CM

## 2016-08-24 ENCOUNTER — Other Ambulatory Visit: Payer: Self-pay | Admitting: Physician Assistant

## 2016-08-24 DIAGNOSIS — I10 Essential (primary) hypertension: Secondary | ICD-10-CM

## 2016-09-30 ENCOUNTER — Other Ambulatory Visit: Payer: Self-pay | Admitting: Family Medicine

## 2016-09-30 DIAGNOSIS — I10 Essential (primary) hypertension: Secondary | ICD-10-CM

## 2016-09-30 NOTE — Telephone Encounter (Signed)
Last exam and lab 11/2015 needs ov

## 2016-10-12 ENCOUNTER — Other Ambulatory Visit: Payer: Self-pay | Admitting: Physician Assistant

## 2016-10-12 DIAGNOSIS — I10 Essential (primary) hypertension: Secondary | ICD-10-CM

## 2016-11-24 ENCOUNTER — Other Ambulatory Visit: Payer: Self-pay | Admitting: Urgent Care

## 2016-11-24 DIAGNOSIS — I10 Essential (primary) hypertension: Secondary | ICD-10-CM

## 2016-11-24 NOTE — Telephone Encounter (Signed)
11/2015 last ov and labs

## 2016-12-03 ENCOUNTER — Other Ambulatory Visit: Payer: Self-pay | Admitting: Physician Assistant

## 2016-12-03 DIAGNOSIS — Z1231 Encounter for screening mammogram for malignant neoplasm of breast: Secondary | ICD-10-CM

## 2016-12-14 ENCOUNTER — Ambulatory Visit (INDEPENDENT_AMBULATORY_CARE_PROVIDER_SITE_OTHER): Payer: BLUE CROSS/BLUE SHIELD | Admitting: Physician Assistant

## 2016-12-14 ENCOUNTER — Ambulatory Visit
Admission: RE | Admit: 2016-12-14 | Discharge: 2016-12-14 | Disposition: A | Discharge status: Home / Self Care | Payer: BLUE CROSS/BLUE SHIELD | Source: Ambulatory Visit | Attending: Physician Assistant | Admitting: Physician Assistant

## 2016-12-14 VITALS — BP 162/80 | HR 83 | Temp 98.0°F | Ht 65.5 in | Wt 172.0 lb

## 2016-12-14 DIAGNOSIS — Z01419 Encounter for gynecological examination (general) (routine) without abnormal findings: Secondary | ICD-10-CM | POA: Diagnosis not present

## 2016-12-14 DIAGNOSIS — Z23 Encounter for immunization: Secondary | ICD-10-CM | POA: Diagnosis not present

## 2016-12-14 DIAGNOSIS — Z1329 Encounter for screening for other suspected endocrine disorder: Secondary | ICD-10-CM | POA: Diagnosis not present

## 2016-12-14 DIAGNOSIS — Z1231 Encounter for screening mammogram for malignant neoplasm of breast: Secondary | ICD-10-CM | POA: Diagnosis not present

## 2016-12-14 DIAGNOSIS — Z130 Encounter for screening for diseases of the blood and blood-forming organs and certain disorders involving the immune mechanism: Secondary | ICD-10-CM | POA: Diagnosis not present

## 2016-12-14 DIAGNOSIS — Z13220 Encounter for screening for lipoid disorders: Secondary | ICD-10-CM

## 2016-12-14 DIAGNOSIS — I10 Essential (primary) hypertension: Secondary | ICD-10-CM

## 2016-12-14 DIAGNOSIS — Z114 Encounter for screening for human immunodeficiency virus [HIV]: Secondary | ICD-10-CM | POA: Diagnosis not present

## 2016-12-14 DIAGNOSIS — R768 Other specified abnormal immunological findings in serum: Secondary | ICD-10-CM | POA: Diagnosis not present

## 2016-12-14 DIAGNOSIS — Z1211 Encounter for screening for malignant neoplasm of colon: Secondary | ICD-10-CM | POA: Diagnosis not present

## 2016-12-14 DIAGNOSIS — R062 Wheezing: Secondary | ICD-10-CM

## 2016-12-14 DIAGNOSIS — Z0000 Encounter for general adult medical examination without abnormal findings: Secondary | ICD-10-CM

## 2016-12-14 DIAGNOSIS — J3089 Other allergic rhinitis: Secondary | ICD-10-CM

## 2016-12-14 DIAGNOSIS — R7689 Other specified abnormal immunological findings in serum: Secondary | ICD-10-CM

## 2016-12-14 DIAGNOSIS — Z13228 Encounter for screening for other metabolic disorders: Secondary | ICD-10-CM

## 2016-12-14 MED ORDER — FLUTICASONE PROPIONATE 50 MCG/ACT NA SUSP: 2.0000 | Freq: Every day | NASAL | 1 refills | Status: DC

## 2016-12-14 MED ORDER — AMLODIPINE BESYLATE 10 MG PO TABS: 10.0000 mg | ORAL_TABLET | Freq: Every day | ORAL | 1 refills | Status: DC

## 2016-12-14 MED ORDER — LISINOPRIL-HYDROCHLOROTHIAZIDE 20-25 MG PO TABS: 1.0000 | ORAL_TABLET | Freq: Every day | ORAL | 1 refills | Status: DC

## 2016-12-14 MED ORDER — ALBUTEROL SULFATE HFA 108 (90 BASE) MCG/ACT IN AERS: 2.0000 | INHALATION_SPRAY | RESPIRATORY_TRACT | 0 refills | Status: DC | PRN

## 2016-12-14 NOTE — Progress Notes (Signed)
Jacqueline Thompson  MRN: [id number] DOB: [birth year]  Subjective:  Pt presents to clinic for a CPE.  She does not check her BP at home but takes her medication daily. Wheezing - 1 inhaler has lasted about 2 years  Last dental exam: been a while Last vision exam: wears glasses - Oct 2017   Last mammogram: this am Last colonoscopy: ? - she had polyps - in [place] - over 5 years Vaccinations      Zostavax - refused  Exercise: active at work Diet: eats both healthy food and junk food, drinks mostly water Sleep: good  Patient Active Problem List   Diagnosis Date Noted  . Hepatitis C antibody test positive 04/26/2015  . Wheezing 02/22/2015  . Essential hypertension 02/22/2015    No current outpatient prescriptions on file prior to visit.   No current facility-administered medications on file prior to visit.     No Known Allergies  Social History   Social History  . Marital status: Legally Separated    Spouse name: N/A  . Number of children: N/A  . Years of education: N/A   Occupational History  . distrubution specialist    Social History Main Topics  . Smoking status: Former Smoker    Years: 10.00    Quit date: 11/29/1997  . Smokeless tobacco: Never Used     Comment: smoked a pack per week  . Alcohol use 2.4 oz/week    3 Glasses of wine, 1 Shots of liquor per week     Comment: 2 drinks a week  . Drug use: No  . Sexual activity: Not Asked   Other Topics Concern  . None   Social History Narrative   Marital status: married   Children: none   Lives with: husband           Seatbelt: 100%   Guns in home: no         Exercise - active at work   Sleep - good       Past Surgical History:  Procedure Laterality Date  . ABDOMINAL HYSTERECTOMY  2007    Family History  Problem Relation Age of Onset  . Heart disease Mother   . Cancer Father     colon,prostate and spine  . Stroke Brother     Review of Systems  Constitutional: Negative.   HENT: Negative.    Eyes: Negative.   Respiratory: Negative.   Cardiovascular: Negative.   Gastrointestinal: Negative.   Endocrine: Negative.   Genitourinary: Negative.   Musculoskeletal: Negative.   Skin: Negative.   Allergic/Immunologic: Negative.   Neurological: Negative.   Hematological: Negative.   Psychiatric/Behavioral: Negative.     Objective:  BP (!) 164/84 (BP Location: Right Arm, Patient Position: Sitting, Cuff Size: Small)   Pulse 83   Temp 98 F (36.7 C) (Oral)   Ht 5' 5.5" (1.664 m)   Wt 172 lb (78 kg)   SpO2 96%   BMI 28.19 kg/m   Physical Exam  Constitutional: She is oriented to person, place, and time and well-developed, well-nourished, and in no distress.  HENT:  Head: Normocephalic and atraumatic.  Right Ear: Hearing, tympanic membrane, external ear and ear canal normal.  Left Ear: Hearing, tympanic membrane, external ear and ear canal normal.  Nose: Nose normal.  Mouth/Throat: Uvula is midline, oropharynx is clear and moist and mucous membranes are normal.  Eyes: Conjunctivae and EOM are normal. Pupils are equal, round, and reactive to light.  Neck: Trachea normal  and normal range of motion. Neck supple. No thyroid mass and no thyromegaly present.  Cardiovascular: Normal rate, regular rhythm and normal heart sounds.   No murmur heard. Pulmonary/Chest: Effort normal and breath sounds normal. She has no wheezes. Right breast exhibits no inverted nipple, no mass, no nipple discharge, no skin change and no tenderness. Left breast exhibits no inverted nipple, no mass, no nipple discharge, no skin change and no tenderness. Breasts are symmetrical.  Abdominal: Soft. Bowel sounds are normal. There is no tenderness.  Musculoskeletal: Normal range of motion.       Right lower leg: She exhibits no edema.       Left lower leg: She exhibits no edema.  Lymphadenopathy:    She has no cervical adenopathy.  Neurological: She is alert and oriented to person, place, and time. She has normal  motor skills, normal sensation, normal strength and normal reflexes. Gait normal.  Skin: Skin is warm and dry.  Psychiatric: Mood, memory, affect and judgment normal.  Vitals reviewed.   Visual Acuity Screening   Right eye Left eye Both eyes  Without correction:     With correction: '20/20 20/20 20/20 '    Assessment and Plan :  Annual physical exam  Need for prophylactic vaccination and inoculation against influenza - Plan: Flu Vaccine QUAD 36+ mos IM  Hepatitis C antibody test positive  Encounter for gynecological examination without abnormal finding  Screen for colon cancer - Plan: Ambulatory referral to Gastroenterology  Screening for deficiency anemia - Plan: CBC with Differential/Platelet  Screening for metabolic disorder - Plan: CMP14+EGFR  Screening, lipid - Plan: Lipid panel  Screening for thyroid disorder - Plan: TSH  Screening for HIV (human immunodeficiency virus) - Plan: HIV antibody  Essential hypertension - Plan: lisinopril-hydrochlorothiazide (PRINZIDE,ZESTORETIC) 20-25 MG tablet, amLODipine (NORVASC) 10 MG tablet - slightly elevated today - pt to recheck with me in a month and then her medications will be adjusted at that time if still high - a year ago her BP was great.  Wheezing - Plan: albuterol (PROVENTIL HFA;VENTOLIN HFA) 108 (90 Base) MCG/ACT inhaler  Allergic rhinitis due to other allergic trigger, unspecified chronicity, unspecified seasonality - Plan: fluticasone (FLONASE) 50 MCG/ACT nasal spray  Windell Hummingbird PA-C  [facility] 12/14/2016 9:51 AM

## 2016-12-14 NOTE — Patient Instructions (Addendum)
Keeping You Healthy  Get These Tests  Blood Pressure- Have your blood pressure checked by your healthcare provider at least once a year.  Normal blood pressure is 120/80.  Weight- Have your body mass index (BMI) calculated to screen for obesity.  BMI is a measure of body fat based on height and weight.  You can calculate your own BMI at www.nhlbisupport.com/bmi/  Cholesterol- Have your cholesterol checked every year.  Diabetes- Have your blood sugar checked every year if you have high blood pressure, high cholesterol, a family history of diabetes or if you are overweight.  Pap Test - Have a pap test every 1 to 5 years if you have been sexually active.  If you are older than 65 and recent pap tests have been normal you may not need additional pap tests.  In addition, if you have had a hysterectomy  for benign disease additional pap tests are not necessary.  Mammogram-Yearly mammograms are essential for early detection of breast cancer  Screening for Colon Cancer- Colonoscopy starting at age 50. Screening may begin sooner depending on your family history and other health conditions.  Follow up colonoscopy as directed by your Gastroenterologist.  Screening for Osteoporosis- Screening begins at age 65 with bone density scanning, sooner if you are at higher risk for developing Osteoporosis.  Get these medicines  Calcium with Vitamin D- Your body requires 1200-1500 mg of Calcium a day and 800-1000 IU of Vitamin D a day.  You can only absorb 500 mg of Calcium at a time therefore Calcium must be taken in 2 or 3 separate doses throughout the day.  Hormones- Hormone therapy has been associated with increased risk for certain cancers and heart disease.  Talk to your healthcare provider about if you need relief from menopausal symptoms.  Aspirin- Ask your healthcare provider about taking Aspirin to prevent Heart Disease and Stroke.  Get these Immuniztions  Flu shot- Every fall  Pneumonia shot-  Once after the age of 65; if you are younger ask your healthcare provider if you need a pneumonia shot.  Tetanus- Every ten years.  Zostavax- Once after the age of 60 to prevent shingles.  Take these steps  Don't smoke- Your healthcare provider can help you quit. For tips on how to quit, ask your healthcare provider or go to www.smokefree.gov or call 1-800 QUIT-NOW.  Be physically active- Exercise 5 days a week for a minimum of 30 minutes.  If you are not already physically active, start slow and gradually work up to 30 minutes of moderate physical activity.  Try walking, dancing, bike riding, swimming, etc.  Eat a healthy diet- Eat a variety of healthy foods such as fruits, vegetables, whole grains, low fat milk, low fat cheeses, yogurt, lean meats, chicken, fish, eggs, dried beans, tofu, etc.  For more information go to www.thenutritionsource.org  Dental visit- Brush and floss teeth twice daily; visit your dentist twice a year.  Eye exam- Visit your Optometrist or Ophthalmologist yearly.  Drink alcohol in moderation- Limit alcohol intake to one drink or less a day.  Never drink and drive.  Depression- Your emotional health is as important as your physical health.  If you're feeling down or losing interest in things you normally enjoy, please talk to your healthcare provider.  Seat Belts- can save your life; always wear one  Smoke/Carbon Monoxide detectors- These detectors need to be installed on the appropriate level of your home.  Replace batteries at least once a year.  Violence- If   anyone is threatening or hurting you, please tell your healthcare provider.  Living Will/ Health care power of attorney- Discuss with your healthcare provider and family.    IF you received an x-ray today, you will receive an invoice from [facility]. Please contact [facility] at [phone] with questions or concerns regarding your invoice.   IF you received labwork today, you  will receive an invoice from [facility]. Please contact [facility] at [phone] with questions or concerns regarding your invoice.   Our billing staff will not be able to assist you with questions regarding bills from these companies.  You will be contacted with the lab results as soon as they are available. The fastest way to get your results is to activate your My Chart account. Instructions are located on the last page of this paperwork. If you have not heard from us regarding the results in 2 weeks, please contact this office.     

## 2016-12-15 LAB — CMP14+EGFR
A/G RATIO: 1.3 (ref 1.2–2.2)
ALBUMIN: 4.3 g/dL (ref 3.6–4.8)
ALT: 19 IU/L (ref 0–32)
AST: 19 IU/L (ref 0–40)
Alkaline Phosphatase: 77 IU/L (ref 39–117)
BILIRUBIN TOTAL: 0.5 mg/dL (ref 0.0–1.2)
BUN / CREAT RATIO: 20 (ref 12–28)
BUN: 16 mg/dL (ref 8–27)
CHLORIDE: 102 mmol/L (ref 96–106)
CO2: 26 mmol/L (ref 18–29)
Calcium: 9.5 mg/dL (ref 8.7–10.3)
Creatinine, Ser: 0.8 mg/dL (ref 0.57–1.00)
GFR calc non Af Amer: 79 mL/min/{1.73_m2} (ref >59)
GFR, EST AFRICAN AMERICAN: 91 mL/min/{1.73_m2} (ref >59)
Globulin, Total: 3.2 g/dL (ref 1.5–4.5)
Glucose: 98 mg/dL (ref 65–99)
POTASSIUM: 3.4 mmol/L — AB (ref 3.5–5.2)
Sodium: 144 mmol/L (ref 134–144)
TOTAL PROTEIN: 7.5 g/dL (ref 6.0–8.5)

## 2016-12-15 LAB — CBC WITH DIFFERENTIAL/PLATELET
BASOS: 1 %
Basophils Absolute: 0.1 10*3/uL (ref 0.0–0.2)
EOS (ABSOLUTE): 0.2 10*3/uL (ref 0.0–0.4)
Eos: 4 %
Hematocrit: 46.1 % (ref 34.0–46.6)
Hemoglobin: 15.6 g/dL (ref 11.1–15.9)
IMMATURE GRANS (ABS): 0 10*3/uL (ref 0.0–0.1)
Immature Granulocytes: 0 %
LYMPHS ABS: 3.4 10*3/uL — AB (ref 0.7–3.1)
LYMPHS: 52 %
MCH: 30.4 pg (ref 26.6–33.0)
MCHC: 33.8 g/dL (ref 31.5–35.7)
MCV: 90 fL (ref 79–97)
Monocytes Absolute: 0.4 10*3/uL (ref 0.1–0.9)
Monocytes: 7 %
NEUTROS ABS: 2.3 10*3/uL (ref 1.4–7.0)
Neutrophils: 36 %
PLATELETS: 280 10*3/uL (ref 150–379)
RBC: 5.14 x10E6/uL (ref 3.77–5.28)
RDW: 14.1 % (ref 12.3–15.4)
WBC: 6.3 10*3/uL (ref 3.4–10.8)

## 2016-12-15 LAB — TSH: TSH: 0.742 u[IU]/mL (ref 0.450–4.500)

## 2016-12-15 LAB — LIPID PANEL
Chol/HDL Ratio: 3.9 ratio units (ref 0.0–4.4)
Cholesterol, Total: 185 mg/dL (ref 100–199)
HDL: 48 mg/dL (ref >39)
LDL Calculated: 126 mg/dL — ABNORMAL HIGH (ref 0–99)
Triglycerides: 56 mg/dL (ref 0–149)
VLDL Cholesterol Cal: 11 mg/dL (ref 5–40)

## 2016-12-15 LAB — HIV ANTIBODY (ROUTINE TESTING W REFLEX): HIV SCREEN 4TH GENERATION: NONREACTIVE

## 2016-12-30 ENCOUNTER — Other Ambulatory Visit: Payer: Self-pay | Admitting: Urgent Care

## 2016-12-30 DIAGNOSIS — I10 Essential (primary) hypertension: Secondary | ICD-10-CM

## 2016-12-31 NOTE — Telephone Encounter (Signed)
Please schedule an appt with me within a month for a BP check.

## 2017-01-10 ENCOUNTER — Ambulatory Visit (INDEPENDENT_AMBULATORY_CARE_PROVIDER_SITE_OTHER): Payer: BLUE CROSS/BLUE SHIELD | Admitting: Physician Assistant

## 2017-01-10 DIAGNOSIS — I10 Essential (primary) hypertension: Secondary | ICD-10-CM | POA: Diagnosis not present

## 2017-01-10 MED ORDER — AMLODIPINE BESYLATE 10 MG PO TABS: 10.0000 mg | ORAL_TABLET | Freq: Every day | ORAL | 1 refills | Status: DC

## 2017-01-10 NOTE — Progress Notes (Signed)
     Patient ID: Jacqueline Thompson, female    DOB: Mar 12, 1956, 61 y.o.   MRN: [id number]  PCP: No primary care provider on file.  Chief Complaint  Patient presents with  . Follow-up    1 month    Subjective:   Presents for follow up one glass of wine a night, cut back on fried foods, trying not to eat fired wings but its hard. She goes up and down stairs every day at work which is where she gets her exercise. She ran out of the Amlodipine Monday but she has one waiting at her pharmacy so she will pick it up today. Colonoscopy due, and its in [facility], wants to change it to [place].    Review of Systems As written above   Patient Active Problem List   Diagnosis Date Noted  . Hepatitis C antibody test positive 04/26/2015  . Wheezing 02/22/2015  . Essential hypertension 02/22/2015    Prior to Admission medications   Medication Sig Start Date End Date Taking? Authorizing Provider  albuterol (PROVENTIL HFA;VENTOLIN HFA) 108 (90 Base) MCG/ACT inhaler Inhale 2 puffs into the lungs every 4 (four) hours as needed for wheezing or shortness of breath (cough, shortness of breath or wheezing.). 12/14/16  Yes Mancel Bale, PA-C  amLODipine (NORVASC) 10 MG tablet Take 1 tablet (10 mg total) by mouth daily. 12/14/16  Yes Mancel Bale, PA-C  fluticasone (FLONASE) 50 MCG/ACT nasal spray Place 2 sprays into both nostrils daily. 12/14/16  Yes Mancel Bale, PA-C  lisinopril-hydrochlorothiazide (PRINZIDE,ZESTORETIC) 20-25 MG tablet Take 1 tablet by mouth daily. 12/14/16  Yes Sarah Alleen Borne, PA-C  lisinopril-hydrochlorothiazide (PRINZIDE,ZESTORETIC) 20-25 MG tablet TAKE 1 TABLET BY MOUTH EVERY DAY 12/31/16  Yes Mancel Bale, PA-C   No Known Allergies  Objective:  Physical Exam  Constitutional: She is oriented to person, place, and time. She appears well-developed and well-nourished. She is active.  Blood pressure (!) 154/99, pulse 96, temperature 98.3 F (36.8 C), temperature source Oral, resp.  rate 16, height 5\' 5"  (1.651 m), weight 172 lb (78 kg), SpO2 96 %.  Eyes: Pupils are equal, round, and reactive to light.  Cardiovascular: Normal rate, regular rhythm and normal heart sounds.   Neurological: She is alert and oriented to person, place, and time.  Skin: Skin is warm and dry.  Psychiatric: She has a normal mood and affect. Her behavior is normal. Judgment and thought content normal.    ASCVD risk 28.4% via MD Calc    Assessment & Plan:  1. Essential hypertension - amLODipine (NORVASC) 10 MG tablet; Take 1 tablet (10 mg total) by mouth daily.  Dispense: 90 tablet; Refill: 1

## 2017-01-10 NOTE — Patient Instructions (Addendum)
 GI [phone] Call them for the colonoscopy -- get the name of the GI in [place] and call me and I will add to your release that you signed today.  Please bring your BP cuff to your next visit   IF you received an x-ray today, you will receive an invoice from [facility]. Please contact [facility] at [phone] with questions or concerns regarding your invoice.   IF you received labwork today, you will receive an invoice from [facility]. Please contact [facility] at [phone] with questions or concerns regarding your invoice.   Our billing staff will not be able to assist you with questions regarding bills from these companies.  You will be contacted with the lab results as soon as they are available. The fastest way to get your results is to activate your My Chart account. Instructions are located on the last page of this paperwork. If you have not heard from [place] regarding the results in 2 weeks, please contact this office.

## 2017-01-11 ENCOUNTER — Telehealth: Payer: Self-pay | Admitting: Family Medicine

## 2017-01-11 ENCOUNTER — Encounter: Payer: Self-pay | Admitting: Physician Assistant

## 2017-01-11 ENCOUNTER — Telehealth: Payer: Self-pay | Admitting: Physician Assistant

## 2017-01-11 NOTE — Progress Notes (Signed)
   Jacqueline Thompson  MRN: [id number] DOB: 09-Jun-1956  PCP: No primary care provider on file.  Chief Complaint  Patient presents with  . Follow-up    1 month    Subjective:  Pt presents to clinic for recheck of her HTN.  She has run out of her norvasc about 4 days ago but she has been taking her lisinopril daily.  She heard from [facility] but she erased the message by accident and forgot to call them back.  She is feeling fine otherwise.  She got a BP machine but she is not sure how to use it correctly but she forgot to bring it in today.  Review of Systems  Constitutional: Negative for chills and fever.  Respiratory: Negative for shortness of breath.   Cardiovascular: Negative for chest pain, palpitations and leg swelling.  Neurological: Negative for headaches.    Patient Active Problem List   Diagnosis Date Noted  . Hepatitis C antibody test positive 04/26/2015  . Wheezing 02/22/2015  . Essential hypertension 02/22/2015    Current Outpatient Prescriptions on File Prior to Visit  Medication Sig Dispense Refill  . albuterol (PROVENTIL HFA;VENTOLIN HFA) 108 (90 Base) MCG/ACT inhaler Inhale 2 puffs into the lungs every 4 (four) hours as needed for wheezing or shortness of breath (cough, shortness of breath or wheezing.). 1 Inhaler 0  . fluticasone (FLONASE) 50 MCG/ACT nasal spray Place 2 sprays into both nostrils daily. 48 g 1  . lisinopril-hydrochlorothiazide (PRINZIDE,ZESTORETIC) 20-25 MG tablet Take 1 tablet by mouth daily. 90 tablet 1   No current facility-administered medications on file prior to visit.     No Known Allergies  Pt patients past, family and social history were reviewed and updated.   Objective:  BP (!) 154/99   Pulse 96   Temp 98.3 F (36.8 C) (Oral)   Resp 16   Ht 5\' 5"  (1.651 m)   Wt 172 lb (78 kg)   SpO2 96%   BMI 28.62 kg/m   Physical Exam  Constitutional: She is oriented to person, place, and time and well-developed, well-nourished, and in no  distress.  HENT:  Head: Normocephalic and atraumatic.  Right Ear: Hearing and external ear normal.  Left Ear: Hearing and external ear normal.  Eyes: Conjunctivae are normal.  Neck: Normal range of motion.  Cardiovascular: Normal rate, regular rhythm and normal heart sounds.   No murmur heard. Pulmonary/Chest: Effort normal and breath sounds normal.  Musculoskeletal:       Right lower leg: She exhibits no edema.       Left lower leg: She exhibits no edema.  Neurological: She is alert and oriented to person, place, and time. Gait normal.  Skin: Skin is warm and dry.  Psychiatric: Mood, memory, affect and judgment normal.  Vitals reviewed.   Assessment and Plan :  Essential hypertension - Plan: amLODipine (NORVASC) 10 MG tablet uncontrolled but pt has been out of medications for the last week.  She will continue her medications and recheck with me in a month while on medications - she is to bring her cuff with her.  Windell Hummingbird PA-C  [facility] 01/11/2017 3:27 PM

## 2017-01-11 NOTE — Telephone Encounter (Signed)
Pt calling to let Windell Hummingbird know that the [facility] in [place] so she will make an appointment here in [place] instead of [place] she also filled out a Release form for her records there to be sent here

## 2017-01-11 NOTE — Telephone Encounter (Signed)
Pt calling to give verbal permisson to release her medical records to [place]  Please advise: [phone]

## 2017-01-11 NOTE — Telephone Encounter (Signed)
Pt would like her medical records faxed from [facility] to PCP? She filled out wrong release, we need a verbal auth. Please get if she calls back. VM left for her to do so.

## 2017-01-11 NOTE — Telephone Encounter (Signed)
Done

## 2017-01-18 ENCOUNTER — Telehealth: Payer: Self-pay | Admitting: Family Medicine

## 2017-01-18 NOTE — Telephone Encounter (Signed)
Pt calling with a verbal request of her medical records she filled out wrong paper

## 2017-01-18 NOTE — Telephone Encounter (Signed)
I called pt back and left VM. In need to know where she would like her information sent or if PCP is to receive her medical info. I need to know which dates of service, past 5 years or a specific date.

## 2017-01-22 NOTE — Telephone Encounter (Signed)
I spoke with pt and got her verbal auth to get her medical records from [facility] on [address]. I sent out request.

## 2017-02-06 ENCOUNTER — Other Ambulatory Visit: Payer: Self-pay | Admitting: Physician Assistant

## 2017-02-06 DIAGNOSIS — I10 Essential (primary) hypertension: Secondary | ICD-10-CM

## 2017-02-19 ENCOUNTER — Encounter: Payer: Self-pay | Admitting: Physician Assistant

## 2017-02-19 ENCOUNTER — Ambulatory Visit (INDEPENDENT_AMBULATORY_CARE_PROVIDER_SITE_OTHER): Payer: BLUE CROSS/BLUE SHIELD | Admitting: Physician Assistant

## 2017-02-19 VITALS — BP 159/79 | HR 73 | Temp 98.0°F | Resp 16 | Ht 65.0 in | Wt 174.2 lb

## 2017-02-19 DIAGNOSIS — I10 Essential (primary) hypertension: Secondary | ICD-10-CM | POA: Diagnosis not present

## 2017-02-19 NOTE — Progress Notes (Signed)
Jacqueline Thompson  MRN: [id number] DOB: [birth year]  PCP: Elizabeth Sauer  Chief Complaint  Patient presents with  . Follow-up    blood pressure    Subjective:  Pt presents to clinic for BP recheck.  She is doing well - she has been taking her Norvasc daily - she has not been checking her BP at home but she brought her cuff in today to have it checked.  She feels fine.  Review of Systems  Constitutional: Negative for chills and fever.  Respiratory: Negative for shortness of breath.   Cardiovascular: Negative for chest pain, palpitations and leg swelling.  Neurological: Negative for headaches.    Patient Active Problem List   Diagnosis Date Noted  . Hepatitis C antibody test positive 04/26/2015  . Wheezing 02/22/2015  . Essential hypertension 02/22/2015    Current Outpatient Prescriptions on File Prior to Visit  Medication Sig Dispense Refill  . albuterol (PROVENTIL HFA;VENTOLIN HFA) 108 (90 Base) MCG/ACT inhaler Inhale 2 puffs into the lungs every 4 (four) hours as needed for wheezing or shortness of breath (cough, shortness of breath or wheezing.). 1 Inhaler 0  . amLODipine (NORVASC) 10 MG tablet Take 1 tablet (10 mg total) by mouth daily. 90 tablet 1  . fluticasone (FLONASE) 50 MCG/ACT nasal spray Place 2 sprays into both nostrils daily. 48 g 1  . lisinopril-hydrochlorothiazide (PRINZIDE,ZESTORETIC) 20-25 MG tablet Take 1 tablet by mouth daily. 90 tablet 1  . lisinopril-hydrochlorothiazide (PRINZIDE,ZESTORETIC) 20-25 MG tablet TAKE 1 TABLET BY MOUTH EVERY DAY 30 tablet 2   No current facility-administered medications on file prior to visit.     No Known Allergies  Pt patients past, family and social history were reviewed and updated.   Objective:  BP (!) 159/79 (BP Location: Left Arm, Patient Position: Sitting, Cuff Size: Normal)   Pulse 73   Temp 98 F (36.7 C) (Oral)   Resp 16   Ht 5\' 5"  (1.651 m)   Wt 174 lb 3.2 oz (79 kg)   SpO2 95%   BMI 28.99 kg/m    Physical Exam  Constitutional: She is oriented to person, place, and time and well-developed, well-nourished, and in no distress.  HENT:  Head: Normocephalic and atraumatic.  Right Ear: Hearing and external ear normal.  Left Ear: Hearing and external ear normal.  Eyes: Conjunctivae are normal.  Neck: Normal range of motion.  Cardiovascular: Normal rate, regular rhythm and normal heart sounds.   No murmur heard. Pulmonary/Chest: Effort normal and breath sounds normal.  Musculoskeletal:       Right lower leg: She exhibits no edema.       Left lower leg: She exhibits no edema.  Neurological: She is alert and oriented to person, place, and time. Gait normal.  Skin: Skin is warm and dry.  Psychiatric: Mood, memory, affect and judgment normal.  Vitals reviewed.   Assessment and Plan :   Problem List Items Addressed This Visit      Cardiovascular and Mediastinum   Essential hypertension - Primary    Better control today while on Norvasc - will recheck the patient in 2 months and at that time determine whether she will need to be switched to valsartan for more improved control. She takes her medication in the am and her visit was in the afternoon - ? Switch to chlorthalidone would help with that.  She will continue to check her BP at home but her cuff is not accurate and she will try and return it  for a replacement.          Windell Hummingbird PA-C  [facility] at [facility] 02/19/2017 5:38 PM

## 2017-02-19 NOTE — Assessment & Plan Note (Signed)
Better control today while on Norvasc - will recheck the patient in 2 months and at that time determine whether she will need to be switched to valsartan for more improved control. She takes her medication in the am and her visit was in the afternoon - ? Switch to chlorthalidone would help with that.  She will continue to check her BP at home but her cuff is not accurate and she will try and return it for a replacement.

## 2017-02-19 NOTE — Patient Instructions (Addendum)
  BP machine reading-- 189/109 In office reading -- 142/90    IF you received an x-ray today, you will receive an invoice from [facility]. Please contact [facility] at [phone] with questions or concerns regarding your invoice.   IF you received labwork today, you will receive an invoice from [facility]. Please contact [facility] at [phone] with questions or concerns regarding your invoice.   Our billing staff will not be able to assist you with questions regarding bills from these companies.  You will be contacted with the lab results as soon as they are available. The fastest way to get your results is to activate your My Chart account. Instructions are located on the last page of this paperwork. If you have not heard from [place] regarding the results in 2 weeks, please contact this office.

## 2017-04-23 ENCOUNTER — Ambulatory Visit (INDEPENDENT_AMBULATORY_CARE_PROVIDER_SITE_OTHER): Payer: BLUE CROSS/BLUE SHIELD | Admitting: Physician Assistant

## 2017-04-23 ENCOUNTER — Encounter: Payer: Self-pay | Admitting: Physician Assistant

## 2017-04-23 DIAGNOSIS — I10 Essential (primary) hypertension: Secondary | ICD-10-CM

## 2017-04-23 MED ORDER — AMLODIPINE BESYLATE 10 MG PO TABS: 10.0000 mg | ORAL_TABLET | Freq: Every day | ORAL | 1 refills | Status: DC

## 2017-04-23 MED ORDER — LISINOPRIL 40 MG PO TABS: 40.0000 mg | ORAL_TABLET | Freq: Every day | ORAL | 0 refills | Status: DC

## 2017-04-23 MED ORDER — CHLORTHALIDONE 50 MG PO TABS: 50.0000 mg | ORAL_TABLET | Freq: Every day | ORAL | 0 refills | Status: DC

## 2017-04-23 NOTE — Patient Instructions (Addendum)
  [facility] - for your colonoscopy you can call and make the appt  Address: [address], [place], [place] [address]  Phone: [phone]  Continue Norvasc (amlodipine)  For the lisinopril /HCTZ - take 1 pill at 7am and another at 1pm until you are finished with that after that you will start the medication that I sent to the pharmacy - it is lisinopril 40mg  once a day and change to chlorthalidone once in the am   I will see you in a month  IF you received an x-ray today, you will receive an invoice from [facility]. Please contact [facility] at [phone] with questions or concerns regarding your invoice.   IF you received labwork today, you will receive an invoice from [facility]. Please contact [facility] at [phone] with questions or concerns regarding your invoice.   Our billing staff will not be able to assist you with questions regarding bills from these companies.  You will be contacted with the lab results as soon as they are available. The fastest way to get your results is to activate your My Chart account. Instructions are located on the last page of this paperwork. If you have not heard from [place] regarding the results in 2 weeks, please contact this office.

## 2017-04-23 NOTE — Progress Notes (Signed)
   Jacqueline Thompson  MRN: [id number] DOB: 1956-09-23  PCP: Mancel Bale, PA-C  Chief Complaint  Patient presents with  . Hypertension    follow up     Subjective:  Pt presents to clinic for BP recheck.  She is not checking her BP at home. She feels good. Taking her medications as directed in the am  Review of Systems  Constitutional: Negative for chills and fever.  Respiratory: Negative for shortness of breath.   Cardiovascular: Negative for chest pain, palpitations and leg swelling.  Neurological: Negative for headaches.    Patient Active Problem List   Diagnosis Date Noted  . Hepatitis C antibody test positive 04/26/2015  . Wheezing 02/22/2015  . Essential hypertension 02/22/2015    Current Outpatient Prescriptions on File Prior to Visit  Medication Sig Dispense Refill  . albuterol (PROVENTIL HFA;VENTOLIN HFA) 108 (90 Base) MCG/ACT inhaler Inhale 2 puffs into the lungs every 4 (four) hours as needed for wheezing or shortness of breath (cough, shortness of breath or wheezing.). 1 Inhaler 0  . fluticasone (FLONASE) 50 MCG/ACT nasal spray Place 2 sprays into both nostrils daily. 48 g 1   No current facility-administered medications on file prior to visit.     No Known Allergies  Pt patients past, family and social history were reviewed and updated.   Objective:  BP (!) 160/85   Pulse 80   Temp 98.3 F (36.8 C) (Oral)   Resp 18   Ht 5\' 5"  (1.651 m)   Wt 172 lb 12.8 oz (78.4 kg)   SpO2 95%   BMI 28.76 kg/m   Physical Exam  Constitutional: She is oriented to person, place, and time and well-developed, well-nourished, and in no distress.  HENT:  Head: Normocephalic and atraumatic.  Right Ear: Hearing and external ear normal.  Left Ear: Hearing and external ear normal.  Eyes: Conjunctivae are normal.  Neck: Normal range of motion.  Cardiovascular: Normal rate, regular rhythm and normal heart sounds.   No murmur heard. Pulmonary/Chest: Effort normal and breath  sounds normal.  Musculoskeletal:       Right lower leg: She exhibits no edema.       Left lower leg: She exhibits no edema.  Neurological: She is alert and oriented to person, place, and time. Gait normal.  Skin: Skin is warm and dry.  Psychiatric: Mood, memory, affect and judgment normal.  Vitals reviewed.   Assessment and Plan :  Essential hypertension - Plan: lisinopril (PRINIVIL,ZESTRIL) 40 MG tablet, chlorthalidone (HYGROTON) 50 MG tablet, amLODipine (NORVASC) 10 MG tablet  Change HCTX to chlorthalidone as I think it is not lasting through the day - she is so close to control - increase the lisinopril to 40mg  and continue the norvasc - recheck in a month.  Windell Hummingbird PA-C  [facility] at [facility] 04/26/2017 8:52 PM

## 2017-05-22 ENCOUNTER — Other Ambulatory Visit: Payer: Self-pay | Admitting: Physician Assistant

## 2017-05-22 DIAGNOSIS — I10 Essential (primary) hypertension: Secondary | ICD-10-CM

## 2017-05-24 ENCOUNTER — Telehealth: Payer: Self-pay | Admitting: Physician Assistant

## 2017-05-24 NOTE — Telephone Encounter (Signed)
[facility] is calling about which blood pressure meds the patient is currently supposed to taking pt is requestng chlorthalide 50mg  and lisinopril 40 mg but recentlythey filled another lisinopril -htcg and amlodopine   Pharmacy number is [phone]

## 2017-05-27 MED ORDER — AMLODIPINE BESYLATE 10 MG PO TABS: 10.0000 mg | ORAL_TABLET | Freq: Every day | ORAL | 0 refills | Status: DC

## 2017-05-27 NOTE — Telephone Encounter (Signed)
Clarified with pharmacy, pt is to take all three BP medication as prescribed and stop HCTZ.

## 2017-05-28 ENCOUNTER — Encounter: Payer: Self-pay | Admitting: Physician Assistant

## 2017-05-28 ENCOUNTER — Ambulatory Visit (INDEPENDENT_AMBULATORY_CARE_PROVIDER_SITE_OTHER): Payer: BLUE CROSS/BLUE SHIELD | Admitting: Physician Assistant

## 2017-05-28 VITALS — BP 148/80 | HR 67 | Temp 98.3°F | Resp 18 | Ht 65.0 in | Wt 172.8 lb

## 2017-05-28 DIAGNOSIS — E876 Hypokalemia: Secondary | ICD-10-CM

## 2017-05-28 DIAGNOSIS — I10 Essential (primary) hypertension: Secondary | ICD-10-CM | POA: Diagnosis not present

## 2017-05-28 NOTE — Patient Instructions (Addendum)
     IF you received an x-ray today, you will receive an invoice from [facility]. Please contact [facility] at [phone] with questions or concerns regarding your invoice.   IF you received labwork today, you will receive an invoice from [facility]. Please contact [facility] at [phone] with questions or concerns regarding your invoice.   Our billing staff will not be able to assist you with questions regarding bills from these companies.  You will be contacted with the lab results as soon as they are available. The fastest way to get your results is to activate your My Chart account. Instructions are located on the last page of this paperwork. If you have not heard from [place] regarding the results in 2 weeks, please contact this office.    [facility] - for your colonoscopy you can call and make the appt  Address: [address], [place], [place] [address]  Phone: [phone]

## 2017-05-28 NOTE — Progress Notes (Signed)
Jacqueline Thompson  MRN: [id number] DOB: 02-05-1956  PCP: Mancel Bale, PA-C  Chief Complaint  Patient presents with  . Hypertension    follow up  . Colonoscopy    needs a pathology report   . Medication Refill    BP meds     Subjective:  Pt presents to clinic for medication refill.  She has been using the lisinopril and the chlorthalidone.  She misunderstood about the continued norvasc - she has not been checking her BP at home. She feels fine.  She has not been able to schedule her colonoscopy because they want her last results which she does not have.  She wants to know what to do next.  History is obtained by patient.  Review of Systems  Constitutional: Negative for chills and fever.  Respiratory: Negative for shortness of breath.   Cardiovascular: Negative for chest pain, palpitations and leg swelling.  Neurological: Negative for headaches.    Patient Active Problem List   Diagnosis Date Noted  . Hepatitis C antibody test positive 04/26/2015  . Wheezing 02/22/2015  . Essential hypertension 02/22/2015    Current Outpatient Prescriptions on File Prior to Visit  Medication Sig Dispense Refill  . albuterol (PROVENTIL HFA;VENTOLIN HFA) 108 (90 Base) MCG/ACT inhaler Inhale 2 puffs into the lungs every 4 (four) hours as needed for wheezing or shortness of breath (cough, shortness of breath or wheezing.). 1 Inhaler 0  . amLODipine (NORVASC) 10 MG tablet Take 1 tablet (10 mg total) by mouth daily. 30 tablet 0  . chlorthalidone (HYGROTON) 50 MG tablet TAKE 1 TABLET BY MOUTH EVERY DAY 30 tablet 0  . fluticasone (FLONASE) 50 MCG/ACT nasal spray Place 2 sprays into both nostrils daily. 48 g 1  . lisinopril (PRINIVIL,ZESTRIL) 40 MG tablet TAKE 1 TABLET BY MOUTH EVERY DAY 30 tablet 0   No current facility-administered medications on file prior to visit.     No Known Allergies  Past Medical History:  Diagnosis Date  . Hypertension    Social History   Social History  Narrative   Marital status: married   Children: none   Lives with: husband           Seatbelt: 100%   Guns in home: no         Exercise - active at work   Sleep - good      Social History  Substance Use Topics  . Smoking status: Former Smoker    Years: 10.00    Quit date: 11/29/1997  . Smokeless tobacco: Never Used     Comment: smoked a pack per week  . Alcohol use 2.4 oz/week    3 Glasses of wine, 1 Shots of liquor per week     Comment: 2 drinks a week   family history includes Cancer in her father; Heart disease in her mother; Stroke in her brother.     Objective:  BP (!) 148/80   Pulse 67   Temp 98.3 F (36.8 C) (Oral)   Resp 18   Ht 5\' 5"  (1.651 m)   Wt 172 lb 12.8 oz (78.4 kg)   SpO2 97%   BMI 28.76 kg/m  Body mass index is 28.76 kg/m.  Physical Exam  Constitutional: She is oriented to person, place, and time and well-developed, well-nourished, and in no distress.  HENT:  Head: Normocephalic and atraumatic.  Right Ear: Hearing and external ear normal.  Left Ear: Hearing and external ear normal.  Eyes: Conjunctivae are  normal.  Neck: Normal range of motion.  Cardiovascular: Normal rate, regular rhythm and normal heart sounds.   No murmur heard. Pulmonary/Chest: Effort normal and breath sounds normal.  Musculoskeletal:       Right lower leg: She exhibits no edema.       Left lower leg: She exhibits no edema.  Neurological: She is alert and oriented to person, place, and time. Gait normal.  Skin: Skin is warm and dry.  Psychiatric: Mood, memory, affect and judgment normal.  Vitals reviewed.   Assessment and Plan :  Essential hypertension - Plan: Basic metabolic panel - she will continue the chlorthalidone and the lisinopril and she will restart the norvasc - her BP today is slightly elevated but with the addition of this medication I suspect that she will be controlled.  Hypokalemia - Plan: potassium chloride (K-DUR) 10 MEQ tablet - pt needs potassium  supplementation - she will recheck in a month where this will be rechecked   Colon cancer screening - called the place she thought she had her last colonoscopy 5 years ago and they did not have her as a patient - she was encouraged and again given the number for [facility] to set up her colonoscopy and let them know her past one was not available.  Windell Hummingbird PA-C  [facility] 05/29/2017 9:31 AM

## 2017-05-29 LAB — BASIC METABOLIC PANEL
BUN/Creatinine Ratio: 21 (ref 12–28)
BUN: 21 mg/dL (ref 8–27)
CALCIUM: 9.6 mg/dL (ref 8.7–10.3)
CO2: 29 mmol/L (ref 20–29)
Chloride: 102 mmol/L (ref 96–106)
Creatinine, Ser: 1 mg/dL (ref 0.57–1.00)
GFR calc non Af Amer: 60 mL/min/{1.73_m2} (ref >59)
GFR, EST AFRICAN AMERICAN: 69 mL/min/{1.73_m2} (ref >59)
GLUCOSE: 90 mg/dL (ref 65–99)
Potassium: 3.3 mmol/L — ABNORMAL LOW (ref 3.5–5.2)
Sodium: 147 mmol/L — ABNORMAL HIGH (ref 134–144)

## 2017-05-29 MED ORDER — POTASSIUM CHLORIDE ER 10 MEQ PO TBCR: 10.0000 meq | EXTENDED_RELEASE_TABLET | Freq: Every day | ORAL | 0 refills | Status: DC

## 2017-06-18 ENCOUNTER — Encounter: Payer: Self-pay | Admitting: Gastroenterology

## 2017-06-27 ENCOUNTER — Other Ambulatory Visit: Payer: Self-pay | Admitting: Physician Assistant

## 2017-06-27 ENCOUNTER — Encounter: Payer: Self-pay | Admitting: Physician Assistant

## 2017-06-27 ENCOUNTER — Ambulatory Visit (INDEPENDENT_AMBULATORY_CARE_PROVIDER_SITE_OTHER): Payer: BLUE CROSS/BLUE SHIELD | Admitting: Physician Assistant

## 2017-06-27 VITALS — BP 118/76 | HR 84 | Temp 98.2°F | Resp 18 | Ht 65.0 in | Wt 169.6 lb

## 2017-06-27 DIAGNOSIS — I10 Essential (primary) hypertension: Secondary | ICD-10-CM

## 2017-06-27 DIAGNOSIS — Z23 Encounter for immunization: Secondary | ICD-10-CM | POA: Diagnosis not present

## 2017-06-27 DIAGNOSIS — E876 Hypokalemia: Secondary | ICD-10-CM | POA: Diagnosis not present

## 2017-06-27 MED ORDER — AMLODIPINE BESYLATE 10 MG PO TABS: 10.0000 mg | ORAL_TABLET | Freq: Every day | ORAL | 1 refills | Status: DC

## 2017-06-27 MED ORDER — LISINOPRIL 40 MG PO TABS: 40.0000 mg | ORAL_TABLET | Freq: Every day | ORAL | 1 refills | Status: DC

## 2017-06-27 MED ORDER — CHLORTHALIDONE 50 MG PO TABS: 50.0000 mg | ORAL_TABLET | Freq: Every day | ORAL | 1 refills | Status: DC

## 2017-06-27 NOTE — Patient Instructions (Signed)
     IF you received an x-ray today, you will receive an invoice from [facility]. Please contact [facility] at [phone] with questions or concerns regarding your invoice.   IF you received labwork today, you will receive an invoice from [facility]. Please contact [facility] at [phone] with questions or concerns regarding your invoice.   Our billing staff will not be able to assist you with questions regarding bills from these companies.  You will be contacted with the lab results as soon as they are available. The fastest way to get your results is to activate your My Chart account. Instructions are located on the last page of this paperwork. If you have not heard from us regarding the results in 2 weeks, please contact this office.     

## 2017-06-27 NOTE — Progress Notes (Signed)
CINTHYA Thompson  MRN: [id number] DOB: 04-Apr-1956  PCP: Mancel Bale, PA-C  Chief Complaint  Patient presents with  . Hypertension    follow up   . potassium    Subjective:  Pt presents to clinic for recheck of her BP - she does not check her BP at home.  She feels good - she takes her medication daily without problems.  History is obtained by patient.  Review of Systems  Constitutional: Negative for chills and fever.  Eyes: Negative for visual disturbance.  Respiratory: Negative for shortness of breath.   Cardiovascular: Negative for chest pain, palpitations and leg swelling.  Neurological: Negative for dizziness, light-headedness and headaches.    Patient Active Problem List   Diagnosis Date Noted  . Hepatitis C antibody test positive 04/26/2015  . Wheezing 02/22/2015  . Essential hypertension 02/22/2015    Current Outpatient Prescriptions on File Prior to Visit  Medication Sig Dispense Refill  . albuterol (PROVENTIL HFA;VENTOLIN HFA) 108 (90 Base) MCG/ACT inhaler Inhale 2 puffs into the lungs every 4 (four) hours as needed for wheezing or shortness of breath (cough, shortness of breath or wheezing.). 1 Inhaler 0  . fluticasone (FLONASE) 50 MCG/ACT nasal spray Place 2 sprays into both nostrils daily. 48 g 1  . potassium chloride (K-DUR) 10 MEQ tablet Take 1 tablet (10 mEq total) by mouth daily. 90 tablet 0   No current facility-administered medications on file prior to visit.     No Known Allergies  Past Medical History:  Diagnosis Date  . Hypertension    Social History   Social History Narrative   Marital status: married   Children: none   Lives with: husband           Seatbelt: 100%   Guns in home: no         Exercise - active at work   Sleep - good      Social History  Substance Use Topics  . Smoking status: Former Smoker    Years: 10.00    Quit date: 11/29/1997  . Smokeless tobacco: Never Used     Comment: smoked a pack per week  . Alcohol  use 2.4 oz/week    3 Glasses of wine, 1 Shots of liquor per week     Comment: 2 drinks a week   family history includes Cancer in her father; Heart disease in her mother; Stroke in her brother.     Objective:  BP 118/76   Pulse 84   Temp 98.2 F (36.8 C) (Oral)   Resp 18   Ht 5\' 5"  (1.651 m)   Wt 169 lb 9.6 oz (76.9 kg)   SpO2 96%   BMI 28.22 kg/m  Body mass index is 28.22 kg/m.  Physical Exam  Constitutional: She is oriented to person, place, and time and well-developed, well-nourished, and in no distress.  HENT:  Head: Normocephalic and atraumatic.  Right Ear: Hearing and external ear normal.  Left Ear: Hearing and external ear normal.  Eyes: Conjunctivae are normal.  Neck: Normal range of motion.  Cardiovascular: Normal rate, regular rhythm and normal heart sounds.   No murmur heard. Pulmonary/Chest: Effort normal and breath sounds normal.  Musculoskeletal:       Right lower leg: She exhibits no edema.       Left lower leg: She exhibits no edema.  Neurological: She is alert and oriented to person, place, and time. Gait normal.  Skin: Skin is warm and dry.  Psychiatric: Mood, memory, affect and judgment normal.  Vitals reviewed.   Assessment and Plan :  Essential hypertension - Plan: lisinopril (PRINIVIL,ZESTRIL) 40 MG tablet, chlorthalidone (HYGROTON) 50 MG tablet, amLODipine (NORVASC) 10 MG tablet - controlled - feel good - refilled medications - recheck in 3 months  Need for influenza vaccination - Plan: Flu Vaccine QUAD 36+ mos IM  Hypokalemia - Plan: Basic metabolic panel - this has been low due to chlorthalidone - KCl was added at last visit - recheck today  Windell Hummingbird PA-C  [facility] 06/27/2017 3:31 PM

## 2017-06-27 NOTE — Addendum Note (Signed)
Addended by: Mancel Bale on: 06/27/2017 04:20 PM   Modules accepted: Orders

## 2017-06-27 NOTE — Progress Notes (Signed)
Opened in error

## 2017-06-28 LAB — BASIC METABOLIC PANEL
BUN / CREAT RATIO: 21 (ref 12–28)
BUN: 20 mg/dL (ref 8–27)
CO2: 26 mmol/L (ref 20–29)
CREATININE: 0.94 mg/dL (ref 0.57–1.00)
Calcium: 10.3 mg/dL (ref 8.7–10.3)
Chloride: 102 mmol/L (ref 96–106)
GFR, EST AFRICAN AMERICAN: 75 mL/min/{1.73_m2} (ref >59)
GFR, EST NON AFRICAN AMERICAN: 65 mL/min/{1.73_m2} (ref >59)
GLUCOSE: 85 mg/dL (ref 65–99)
Potassium: 3.3 mmol/L — ABNORMAL LOW (ref 3.5–5.2)
SODIUM: 145 mmol/L — AB (ref 134–144)

## 2017-07-02 MED ORDER — POTASSIUM CHLORIDE ER 20 MEQ PO TBCR: 20.0000 meq | EXTENDED_RELEASE_TABLET | Freq: Every day | ORAL | 1 refills | Status: DC

## 2017-07-02 NOTE — Addendum Note (Signed)
Addended by: Mancel Bale on: 07/02/2017 10:16 AM   Modules accepted: Orders

## 2017-07-25 ENCOUNTER — Ambulatory Visit (AMBULATORY_SURGERY_CENTER): Payer: Self-pay

## 2017-07-25 VITALS — Ht 65.0 in | Wt 171.6 lb

## 2017-07-25 DIAGNOSIS — Z1211 Encounter for screening for malignant neoplasm of colon: Secondary | ICD-10-CM

## 2017-07-25 MED ORDER — NA SULFATE-K SULFATE-MG SULF 17.5-3.13-1.6 GM/177ML PO SOLN: 1.0000 | Freq: Once | ORAL | 0 refills | Status: AC

## 2017-07-25 NOTE — Progress Notes (Signed)
Denies allergies to eggs or soy products. Denies complication of anesthesia or sedation. Denies use of weight loss medication. Denies use of O2.   Emmi instructions given for colonoscopy.  

## 2017-07-26 ENCOUNTER — Encounter: Payer: Self-pay | Admitting: Gastroenterology

## 2017-08-09 ENCOUNTER — Encounter: Payer: Self-pay | Admitting: Gastroenterology

## 2017-08-09 ENCOUNTER — Ambulatory Visit (AMBULATORY_SURGERY_CENTER): Payer: BLUE CROSS/BLUE SHIELD | Admitting: Gastroenterology

## 2017-08-09 VITALS — BP 131/79 | HR 57 | Temp 97.5°F | Resp 9 | Ht 65.0 in | Wt 171.0 lb

## 2017-08-09 DIAGNOSIS — Z1212 Encounter for screening for malignant neoplasm of rectum: Secondary | ICD-10-CM | POA: Diagnosis not present

## 2017-08-09 DIAGNOSIS — K635 Polyp of colon: Secondary | ICD-10-CM | POA: Diagnosis not present

## 2017-08-09 DIAGNOSIS — Z1211 Encounter for screening for malignant neoplasm of colon: Secondary | ICD-10-CM

## 2017-08-09 DIAGNOSIS — D123 Benign neoplasm of transverse colon: Secondary | ICD-10-CM

## 2017-08-09 DIAGNOSIS — D122 Benign neoplasm of ascending colon: Secondary | ICD-10-CM

## 2017-08-09 MED ORDER — SODIUM CHLORIDE 0.9 % IV SOLN: 500.0000 mL | INTRAVENOUS | Status: DC

## 2017-08-09 NOTE — Patient Instructions (Signed)
Handouts given on polyps and Diverticulosis   YOU HAD AN ENDOSCOPIC PROCEDURE TODAY AT [facility]:   Refer to the procedure report that was given to you for any specific questions about what was found during the examination.  If the procedure report does not answer your questions, please call your gastroenterologist to clarify.  If you requested that your care partner not be given the details of your procedure findings, then the procedure report has been included in a sealed envelope for you to review at your convenience later.  YOU SHOULD EXPECT: Some feelings of bloating in the abdomen. Passage of more gas than usual.  Walking can help get rid of the air that was put into your GI tract during the procedure and reduce the bloating. If you had a lower endoscopy (such as a colonoscopy or flexible sigmoidoscopy) you may notice spotting of blood in your stool or on the toilet paper. If you underwent a bowel prep for your procedure, you may not have a normal bowel movement for a few days.  Please Note:  You might notice some irritation and congestion in your nose or some drainage.  This is from the oxygen used during your procedure.  There is no need for concern and it should clear up in a day or so.  SYMPTOMS TO REPORT IMMEDIATELY:   Following lower endoscopy (colonoscopy or flexible sigmoidoscopy):  Excessive amounts of blood in the stool  Significant tenderness or worsening of abdominal pains  Swelling of the abdomen that is new, acute  Fever of 100F or higher   For urgent or emergent issues, a gastroenterologist can be reached at any hour by calling [phone].   DIET:  We do recommend a small meal at first, but then you may proceed to your regular diet.  Drink plenty of fluids but you should avoid alcoholic beverages for 24 hours.  ACTIVITY:  You should plan to take it easy for the rest of today and you should NOT DRIVE or use heavy machinery until tomorrow (because of  the sedation medicines used during the test).    FOLLOW UP: Our staff will call the number listed on your records the next business day following your procedure to check on you and address any questions or concerns that you may have regarding the information given to you following your procedure. If we do not reach you, we will leave a message.  However, if you are feeling well and you are not experiencing any problems, there is no need to return our call.  We will assume that you have returned to your regular daily activities without incident.  If any biopsies were taken you will be contacted by phone or by letter within the next 1-3 weeks.  Please call us at [phone] if you have not heard about the biopsies in 3 weeks.    SIGNATURES/CONFIDENTIALITY: You and/or your care partner have signed paperwork which will be entered into your electronic medical record.  These signatures attest to the fact that that the information above on your After Visit Summary has been reviewed and is understood.  Full responsibility of the confidentiality of this discharge information lies with you and/or your care-partner.  Thank you for letting us take care of your healthcare needs today.

## 2017-08-09 NOTE — Progress Notes (Signed)
Called to room to assist during endoscopic procedure.  Patient ID and intended procedure confirmed with present staff. Received instructions for my participation in the procedure from the performing physician.  

## 2017-08-09 NOTE — Op Note (Signed)
[facility] Patient Name: Jacqueline Thompson Procedure Date: 08/09/2017 8:00 AM MRN: [id number] Endoscopist: Mallie Mussel L. Loletha Carrow , MD Age: 61 Referring MD:  Date of Birth: 07-19-1956 Gender: Female Account #: [id number] Procedure:                Colonoscopy Indications:              Screening for colorectal malignant neoplasm                            (patient reports colonoscopy at outside institution                            10 years ago - no report available) Medicines:                Monitored Anesthesia Care Procedure:                Pre-Anesthesia Assessment:                           - Prior to the procedure, a History and Physical                            was performed, and patient medications and                            allergies were reviewed. The patient's tolerance of                            previous anesthesia was also reviewed. The risks                            and benefits of the procedure and the sedation                            options and risks were discussed with the patient.                            All questions were answered, and informed consent                            was obtained. Prior Anticoagulants: The patient has                            taken no previous anticoagulant or antiplatelet                            agents. ASA Grade Assessment: II - A patient with                            mild systemic disease. After reviewing the risks                            and benefits, the patient was deemed in  satisfactory condition to undergo the procedure.                           After obtaining informed consent, the colonoscope                            was passed under direct vision. Throughout the                            procedure, the patient's blood pressure, pulse, and                            oxygen saturations were monitored continuously. The                            Colonoscope was introduced  through the anus and                            advanced to the the terminal ileum. The colonoscopy                            was performed without difficulty. The patient                            tolerated the procedure well. The quality of the                            bowel preparation was good. The terminal ileum,                            ileocecal valve, appendiceal orifice, and rectum                            were photographed. The quality of the bowel                            preparation was evaluated using the BBPS [facility]                            Bowel Preparation Scale) with scores of: Right                            Colon = 2, Transverse Colon = 3 and Left Colon = 2.                            The total BBPS score equals 7. The bowel                            preparation used was SUPREP. Scope In: 8:06:33 AM Scope Out: 8:25:03 AM Scope Withdrawal Time: 0 hours 17 minutes 39 seconds  Total Procedure Duration: 0 hours 18 minutes 30 seconds  Findings:                 The perianal and digital  rectal examinations were                            normal.                           Two sessile polyps were found in the ascending                            colon. The polyps were 4 mm in size. These polyps                            were removed with a cold snare. Resection and                            retrieval were complete.                           A 4 mm polyp was found in the mid transverse colon.                            The polyp was flat. The polyp was removed with a                            piecemeal technique using a cold biopsy forceps.                            Resection and retrieval were complete.                           Diverticula were found in the entire colon.                           Retroflexion in the rectum was not performed due to                            anatomy.                           The exam was otherwise without  abnormality. Complications:            No immediate complications. Estimated Blood Loss:     Estimated blood loss was minimal. Impression:               - Two 4 mm polyps in the ascending colon, removed                            with a cold snare. Resected and retrieved.                           - One 4 mm polyp in the mid transverse colon,                            removed piecemeal using a cold biopsy forceps.  Resected and retrieved.                           - Diverticulosis in the entire examined colon.                           - The examination was otherwise normal. Recommendation:           - Patient has a contact number available for                            emergencies. The signs and symptoms of potential                            delayed complications were discussed with the                            patient. Return to normal activities tomorrow.                            Written discharge instructions were provided to the                            patient.                           - Resume previous diet.                           - Continue present medications.                           - Await pathology results.                           - Repeat colonoscopy is recommended for                            surveillance. The colonoscopy date will be                            determined after pathology results from today's                            exam become available for review. Toby Breithaupt L. Loletha Carrow, MD 08/09/2017 8:29:13 AM This report has been signed electronically.

## 2017-08-09 NOTE — Progress Notes (Signed)
To recovery, report to RN, VSS. 

## 2017-08-12 ENCOUNTER — Telehealth: Payer: Self-pay

## 2017-08-12 NOTE — Telephone Encounter (Signed)
  Follow up Call-  Call back number 08/09/2017  Post procedure Call Back phone  # [phone]  Permission to leave phone message Yes  Some recent data might be hidden     Patient questions:  Do you have a fever, pain , or abdominal swelling? No. Pain Score  0 *  Have you tolerated food without any problems? Yes.    Have you been able to return to your normal activities? Yes.    Do you have any questions about your discharge instructions: Diet   No. Medications  No. Follow up visit  No.  Do you have questions or concerns about your Care? No.  Actions: * If pain score is 4 or above: No action needed, pain <4.

## 2017-08-19 ENCOUNTER — Encounter: Payer: Self-pay | Admitting: Gastroenterology

## 2017-10-01 ENCOUNTER — Ambulatory Visit: Payer: BLUE CROSS/BLUE SHIELD | Admitting: Physician Assistant

## 2017-12-03 ENCOUNTER — Other Ambulatory Visit: Payer: Self-pay

## 2017-12-03 ENCOUNTER — Encounter: Payer: Self-pay | Admitting: Physician Assistant

## 2017-12-03 ENCOUNTER — Ambulatory Visit (INDEPENDENT_AMBULATORY_CARE_PROVIDER_SITE_OTHER): Payer: BLUE CROSS/BLUE SHIELD | Admitting: Physician Assistant

## 2017-12-03 VITALS — BP 140/78 | HR 106 | Temp 98.8°F | Resp 18 | Ht 65.0 in | Wt 169.8 lb

## 2017-12-03 DIAGNOSIS — I10 Essential (primary) hypertension: Secondary | ICD-10-CM | POA: Diagnosis not present

## 2017-12-03 DIAGNOSIS — E876 Hypokalemia: Secondary | ICD-10-CM

## 2017-12-03 NOTE — Patient Instructions (Addendum)
Start taking lisinopril and chlorthalidone in the am and the amlodipine in the pm - lets see if that gets your BP better controlled.  Heat to the back when you feel that your muscle back    IF you received an x-ray today, you will receive an invoice from [facility]. Please contact [facility] at [phone] with questions or concerns regarding your invoice.   IF you received labwork today, you will receive an invoice from [facility]. Please contact [facility] at [phone] with questions or concerns regarding your invoice.   Our billing staff will not be able to assist you with questions regarding bills from these companies.  You will be contacted with the lab results as soon as they are available. The fastest way to get your results is to activate your My Chart account. Instructions are located on the last page of this paperwork. If you have not heard from [place] regarding the results in 2 weeks, please contact this office.

## 2017-12-03 NOTE — Progress Notes (Signed)
Jacqueline Thompson  MRN: [id number] DOB: 11/19/1955  PCP: Mancel Bale, PA-C  Chief Complaint  Patient presents with  . Hypertension    follow up     depression screening was a 2     Subjective:  Pt presents to clinic for HTN recheck.  She does not check her BP at home.  She feels good.    History is obtained by patient.  Review of Systems  Constitutional: Negative for chills and fever.  Eyes: Negative for visual disturbance.  Respiratory: Negative for shortness of breath.   Cardiovascular: Negative for chest pain, palpitations and leg swelling.  Neurological: Negative for dizziness, light-headedness and headaches.    Patient Active Problem List   Diagnosis Date Noted  . Hepatitis C antibody test positive 04/26/2015  . Wheezing 02/22/2015  . Essential hypertension 02/22/2015    Current Outpatient Medications on File Prior to Visit  Medication Sig Dispense Refill  . albuterol (PROVENTIL HFA;VENTOLIN HFA) 108 (90 Base) MCG/ACT inhaler Inhale 2 puffs into the lungs every 4 (four) hours as needed for wheezing or shortness of breath (cough, shortness of breath or wheezing.). 1 Inhaler 0  . amLODipine (NORVASC) 10 MG tablet Take 1 tablet (10 mg total) by mouth daily. 90 tablet 1  . chlorthalidone (HYGROTON) 50 MG tablet Take 1 tablet (50 mg total) by mouth daily. 90 tablet 1  . fluticasone (FLONASE) 50 MCG/ACT nasal spray Place 2 sprays into both nostrils daily. 48 g 1  . lisinopril (PRINIVIL,ZESTRIL) 40 MG tablet Take 1 tablet (40 mg total) by mouth daily. 90 tablet 1  . potassium chloride 20 MEQ TBCR Take 20 mEq by mouth daily. 90 tablet 1   Current Facility-Administered Medications on File Prior to Visit  Medication Dose Route Frequency Provider Last Rate Last Dose  . 0.9 %  sodium chloride infusion  500 mL Intravenous Continuous Danis, Kirke Corin, MD        No Known Allergies  Past Medical History:  Diagnosis Date  . Allergy   . Hypertension    Social History    Social History Narrative   Marital status: married   Children: none   Lives with: husband           Seatbelt: 100%   Guns in home: no         Exercise - active at work   Sleep - good   Social History   Tobacco Use  . Smoking status: Former Smoker    Years: 10.00    Last attempt to quit: 11/29/1997    Years since quitting: 20.0  . Smokeless tobacco: Never Used  . Tobacco comment: smoked a pack per week  Substance Use Topics  . Alcohol use: Yes    Alcohol/week: 2.4 oz    Types: 3 Glasses of wine, 1 Shots of liquor per week    Comment: 2 drinks a week  . Drug use: No   family history includes Cancer in her father; Heart disease in her mother; Stroke in her brother.     Objective:  BP 140/78   Pulse (!) 106   Temp 98.8 F (37.1 C) (Oral)   Resp 18   Ht _0  (1.651 m)   Wt 169 lb 12.8 oz (77 kg)   SpO2 97%   BMI 28.26 kg/m  Body mass index is 28.26 kg/m.  Physical Exam  Constitutional: She is oriented to person, place, and time and well-developed, well-nourished, and in no distress.  HENT:  Head: Normocephalic and atraumatic.  Right Ear: Hearing and external ear normal.  Left Ear: Hearing and external ear normal.  Eyes: Conjunctivae are normal.  Neck: Normal range of motion.  Cardiovascular: Normal rate, regular rhythm and normal heart sounds.  No murmur heard. Pulmonary/Chest: Effort normal and breath sounds normal. She has no wheezes.  Neurological: She is alert and oriented to person, place, and time. Gait normal.  Skin: Skin is warm and dry.  Psychiatric: Mood, memory, affect and judgment normal.  Vitals reviewed.   Assessment and Plan :  Essential hypertension - Plan: CMP14+EGFR - not well controlled today - she will split her medication to see if that controls her better - if that does not work consider tribenzor to better control her BP.  Windell Hummingbird PA-C  [facility] 12/03/2017 6:04 PM

## 2017-12-04 LAB — CMP14+EGFR
ALBUMIN: 4.4 g/dL (ref 3.6–4.8)
ALT: 20 IU/L (ref 0–32)
AST: 25 IU/L (ref 0–40)
Albumin/Globulin Ratio: 1.3 (ref 1.2–2.2)
Alkaline Phosphatase: 69 IU/L (ref 39–117)
BUN / CREAT RATIO: 18 (ref 12–28)
BUN: 16 mg/dL (ref 8–27)
Bilirubin Total: 0.2 mg/dL (ref 0.0–1.2)
CO2: 23 mmol/L (ref 20–29)
CREATININE: 0.89 mg/dL (ref 0.57–1.00)
Calcium: 9.5 mg/dL (ref 8.7–10.3)
Chloride: 104 mmol/L (ref 96–106)
GFR calc non Af Amer: 69 mL/min/{1.73_m2} (ref >59)
GFR, EST AFRICAN AMERICAN: 80 mL/min/{1.73_m2} (ref >59)
GLUCOSE: 99 mg/dL (ref 65–99)
Globulin, Total: 3.3 g/dL (ref 1.5–4.5)
Potassium: 3.5 mmol/L (ref 3.5–5.2)
Sodium: 147 mmol/L — ABNORMAL HIGH (ref 134–144)
TOTAL PROTEIN: 7.7 g/dL (ref 6.0–8.5)

## 2017-12-06 MED ORDER — POTASSIUM CHLORIDE ER 20 MEQ PO TBCR: 20.0000 meq | EXTENDED_RELEASE_TABLET | Freq: Every day | ORAL | 1 refills | Status: DC

## 2017-12-06 NOTE — Addendum Note (Signed)
Addended by: Mancel Bale on: 12/06/2017 12:59 PM   Modules accepted: Orders

## 2017-12-30 ENCOUNTER — Other Ambulatory Visit: Payer: Self-pay | Admitting: Physician Assistant

## 2017-12-30 DIAGNOSIS — E876 Hypokalemia: Secondary | ICD-10-CM

## 2017-12-30 DIAGNOSIS — I10 Essential (primary) hypertension: Secondary | ICD-10-CM

## 2018-02-04 ENCOUNTER — Ambulatory Visit: Payer: BLUE CROSS/BLUE SHIELD | Admitting: Physician Assistant

## 2018-02-04 ENCOUNTER — Encounter: Payer: Self-pay | Admitting: Physician Assistant

## 2018-02-04 ENCOUNTER — Other Ambulatory Visit: Payer: Self-pay

## 2018-02-04 DIAGNOSIS — J302 Other seasonal allergic rhinitis: Secondary | ICD-10-CM | POA: Diagnosis not present

## 2018-02-04 DIAGNOSIS — E876 Hypokalemia: Secondary | ICD-10-CM | POA: Diagnosis not present

## 2018-02-04 DIAGNOSIS — I10 Essential (primary) hypertension: Secondary | ICD-10-CM

## 2018-02-04 MED ORDER — POTASSIUM CHLORIDE CRYS ER 20 MEQ PO TBCR: 20.0000 meq | EXTENDED_RELEASE_TABLET | Freq: Every day | ORAL | 1 refills | Status: DC

## 2018-02-04 MED ORDER — FLUTICASONE PROPIONATE 50 MCG/ACT NA SUSP: 2.0000 | Freq: Every day | NASAL | 1 refills | Status: DC

## 2018-02-04 MED ORDER — AMLODIPINE BESYLATE 10 MG PO TABS: 10.0000 mg | ORAL_TABLET | Freq: Every day | ORAL | 1 refills | Status: DC

## 2018-02-04 MED ORDER — CHLORTHALIDONE 50 MG PO TABS: 50.0000 mg | ORAL_TABLET | Freq: Every day | ORAL | 1 refills | Status: DC

## 2018-02-04 MED ORDER — LISINOPRIL 40 MG PO TABS: 40.0000 mg | ORAL_TABLET | Freq: Every day | ORAL | 1 refills | Status: DC

## 2018-02-04 NOTE — Patient Instructions (Signed)
     IF you received an x-ray today, you will receive an invoice from [facility]. Please contact [facility] at [phone] with questions or concerns regarding your invoice.   IF you received labwork today, you will receive an invoice from [facility]. Please contact [facility] at [phone] with questions or concerns regarding your invoice.   Our billing staff will not be able to assist you with questions regarding bills from these companies.  You will be contacted with the lab results as soon as they are available. The fastest way to get your results is to activate your My Chart account. Instructions are located on the last page of this paperwork. If you have not heard from us regarding the results in 2 weeks, please contact this office.     

## 2018-02-04 NOTE — Progress Notes (Signed)
Jacqueline Thompson  MRN: [id number] DOB: 07/22/1956  PCP: Mancel Bale, PA-C  Chief Complaint  Patient presents with  . Hypertension    follow up needs refill on amlodipine     Subjective:  Pt presents to clinic for recheck of her HTN.  She has split her dose up and she is taking half her meds in the am and then other half in the pm and since she has done that her BP has been consistently under 140/90.  She feels good.  She has also lost 5 lbs by healthy eating.  She needs some more flonase as her allergies are getting bad since the pollen has started.  History is obtained by patient.  Review of Systems  Constitutional: Negative for chills and fever.  Eyes: Negative for visual disturbance.  Respiratory: Negative for shortness of breath.   Cardiovascular: Negative for chest pain, palpitations and leg swelling.  Neurological: Negative for dizziness, light-headedness and headaches.    Patient Active Problem List   Diagnosis Date Noted  . Hepatitis C antibody test positive 04/26/2015  . Wheezing 02/22/2015  . Essential hypertension 02/22/2015    Current Outpatient Medications on File Prior to Visit  Medication Sig Dispense Refill  . albuterol (PROVENTIL HFA;VENTOLIN HFA) 108 (90 Base) MCG/ACT inhaler Inhale 2 puffs into the lungs every 4 (four) hours as needed for wheezing or shortness of breath (cough, shortness of breath or wheezing.). 1 Inhaler 0   No current facility-administered medications on file prior to visit.     No Known Allergies  Past Medical History:  Diagnosis Date  . Allergy   . Hypertension    Social History   Social History Narrative   Marital status: married   Children: none   Lives with: husband           Seatbelt: 100%   Guns in home: no         Exercise - active at work   Sleep - good   Social History   Tobacco Use  . Smoking status: Former Smoker    Years: 10.00    Last attempt to quit: 11/29/1997    Years since quitting: 20.1  .  Smokeless tobacco: Never Used  . Tobacco comment: smoked a pack per week  Substance Use Topics  . Alcohol use: Yes    Alcohol/week: 2.4 oz    Types: 3 Glasses of wine, 1 Shots of liquor per week    Comment: 2 drinks a week  . Drug use: No   family history includes Cancer in her father; Heart disease in her mother; Stroke in her brother.     Objective:  BP 128/62   Pulse 85   Temp 98.6 F (37 C) (Oral)   Resp 18   Ht 5\' 5"  (1.651 m)   Wt 164 lb 6.4 oz (74.6 kg)   SpO2 99%   BMI 27.36 kg/m  Body mass index is 27.36 kg/m.  Physical Exam  Constitutional: She is oriented to person, place, and time and well-developed, well-nourished, and in no distress.  HENT:  Head: Normocephalic and atraumatic.  Right Ear: Hearing and external ear normal.  Left Ear: Hearing and external ear normal.  Eyes: Conjunctivae are normal.  Neck: Normal range of motion.  Cardiovascular: Normal rate, regular rhythm and normal heart sounds.  No murmur heard. Pulmonary/Chest: Effort normal and breath sounds normal.  Musculoskeletal:       Right lower leg: She exhibits no edema.  Left lower leg: She exhibits no edema.  Neurological: She is alert and oriented to person, place, and time. Gait normal.  Skin: Skin is warm and dry.  Psychiatric: Mood, memory, affect and judgment normal.  Vitals reviewed.   Assessment and Plan :  Essential hypertension - Plan: lisinopril (PRINIVIL,ZESTRIL) 40 MG tablet, chlorthalidone (HYGROTON) 50 MG tablet, amLODipine (NORVASC) 10 MG tablet  Seasonal allergies - Plan: fluticasone (FLONASE) 50 MCG/ACT nasal spray  Hypokalemia - Plan: potassium chloride SA (KLOR-CON M20) 20 MEQ tablet   HTN is controlled with split of dose.  Medications refilled.  Recheck in 3 months for blood work.  Windell Hummingbird PA-C  [facility] 02/04/2018 4:49 PM

## 2018-03-25 ENCOUNTER — Encounter: Payer: Self-pay | Admitting: Family Medicine

## 2018-04-07 ENCOUNTER — Telehealth: Payer: Self-pay | Admitting: Physician Assistant

## 2018-04-07 NOTE — Telephone Encounter (Signed)
Called pt to cancel appt on 05/13/18 as [facility] will not be in that day. If pt calls please reschedule.

## 2018-05-07 ENCOUNTER — Ambulatory Visit: Payer: BLUE CROSS/BLUE SHIELD | Admitting: Physician Assistant

## 2018-05-13 ENCOUNTER — Ambulatory Visit: Payer: BLUE CROSS/BLUE SHIELD | Admitting: Physician Assistant

## 2018-05-20 ENCOUNTER — Other Ambulatory Visit: Payer: Self-pay

## 2018-05-20 ENCOUNTER — Encounter: Payer: Self-pay | Admitting: Physician Assistant

## 2018-05-20 ENCOUNTER — Ambulatory Visit: Payer: BLUE CROSS/BLUE SHIELD | Admitting: Physician Assistant

## 2018-05-20 VITALS — BP 130/72 | HR 85 | Temp 98.6°F | Resp 18 | Ht 65.0 in | Wt 169.4 lb

## 2018-05-20 DIAGNOSIS — I10 Essential (primary) hypertension: Secondary | ICD-10-CM

## 2018-05-20 DIAGNOSIS — E876 Hypokalemia: Secondary | ICD-10-CM

## 2018-05-20 DIAGNOSIS — E7800 Pure hypercholesterolemia, unspecified: Secondary | ICD-10-CM | POA: Diagnosis not present

## 2018-05-20 MED ORDER — POTASSIUM CHLORIDE CRYS ER 20 MEQ PO TBCR: 20.0000 meq | EXTENDED_RELEASE_TABLET | Freq: Every day | ORAL | 1 refills | Status: DC

## 2018-05-20 MED ORDER — LISINOPRIL 40 MG PO TABS: 40.0000 mg | ORAL_TABLET | Freq: Every day | ORAL | 1 refills | Status: DC

## 2018-05-20 MED ORDER — AMLODIPINE BESYLATE 10 MG PO TABS
10.0000 mg | ORAL_TABLET | Freq: Every day | ORAL | 1 refills | Status: DC
Start: 2018-05-20 — End: 2019-06-10

## 2018-05-20 MED ORDER — CHLORTHALIDONE 50 MG PO TABS: 50.0000 mg | ORAL_TABLET | Freq: Every day | ORAL | 1 refills | Status: DC

## 2018-05-20 NOTE — Progress Notes (Signed)
Jacqueline Thompson  MRN: [id number] DOB: [birth year]  PCP: Mancel Bale, PA-C  Chief Complaint  Patient presents with  . Hypertension    follow up     Subjective:  Pt presents to clinic for medication refill.  She has been doing well.  At home 130/70s.  She has been without her potassium for about a week.  She takes her BP medications daily without problems.  She has been doing really good.  History is obtained by patient.  Review of Systems  Constitutional: Negative for chills and fever.  Eyes: Negative for visual disturbance.  Respiratory: Negative for shortness of breath.   Cardiovascular: Negative for chest pain, palpitations and leg swelling.  Neurological: Negative for dizziness, light-headedness and headaches.    Patient Active Problem List   Diagnosis Date Noted  . Elevated LDL cholesterol level 05/20/2018  . Hepatitis C antibody test positive 04/26/2015  . Wheezing 02/22/2015  . Essential hypertension 02/22/2015    Current Outpatient Medications on File Prior to Visit  Medication Sig Dispense Refill  . albuterol (PROVENTIL HFA;VENTOLIN HFA) 108 (90 Base) MCG/ACT inhaler Inhale 2 puffs into the lungs every 4 (four) hours as needed for wheezing or shortness of breath (cough, shortness of breath or wheezing.). 1 Inhaler 0  . fluticasone (FLONASE) 50 MCG/ACT nasal spray Place 2 sprays into both nostrils daily. 48 g 1   No current facility-administered medications on file prior to visit.     No Known Allergies  Past Medical History:  Diagnosis Date  . Allergy   . Hypertension    Social History   Social History Narrative   Marital status: married   Children: none   Lives with: husband           Seatbelt: 100%   Guns in home: no         Exercise - active at work   Sleep - good   Social History   Tobacco Use  . Smoking status: Former Smoker    Years: 10.00    Last attempt to quit: 11/29/1997    Years since quitting: 20.4  . Smokeless tobacco: Never  Used  . Tobacco comment: smoked a pack per week  Substance Use Topics  . Alcohol use: Yes    Alcohol/week: 2.4 oz    Types: 3 Glasses of wine, 1 Shots of liquor per week    Comment: 2 drinks a week  . Drug use: No   family history includes Cancer in her father; Heart disease in her mother; Stroke in her brother.     Objective:  BP 130/72   Pulse 85   Temp 98.6 F (37 C) (Oral)   Resp 18   Ht '5\' 5"'$  (1.651 m)   Wt 169 lb 6.4 oz (76.8 kg)   SpO2 96%   BMI 28.19 kg/m  Body mass index is 28.19 kg/m.  Wt Readings from Last 3 Encounters:  05/20/18 169 lb 6.4 oz (76.8 kg)  02/04/18 164 lb 6.4 oz (74.6 kg)  12/03/17 169 lb 12.8 oz (77 kg)    Physical Exam  Constitutional: She is oriented to person, place, and time. She appears well-developed and well-nourished.  HENT:  Head: Normocephalic and atraumatic.  Right Ear: Hearing and external ear normal.  Left Ear: Hearing and external ear normal.  Eyes: Conjunctivae are normal.  Neck: Normal range of motion.  Cardiovascular: Normal rate, regular rhythm and normal heart sounds.  No murmur heard. Pulmonary/Chest: Effort normal and breath sounds  normal.  Musculoskeletal:       Right lower leg: She exhibits no edema.       Left lower leg: She exhibits no edema.  Neurological: She is alert and oriented to person, place, and time.  Skin: Skin is warm and dry.  Psychiatric: She has a normal mood and affect. Her behavior is normal. Judgment and thought content normal.  Vitals reviewed.   Assessment and Plan :  Essential hypertension - Plan: lisinopril (PRINIVIL,ZESTRIL) 40 MG tablet, chlorthalidone (HYGROTON) 50 MG tablet, amLODipine (NORVASC) 10 MG tablet, CMP14+EGFR  Elevated LDL cholesterol level - Plan: Lipid panel  Hypokalemia - Plan: potassium chloride SA (KLOR-CON M20) 20 MEQ tablet   Well controlled BP - check labs -- continue medications  - get back on her potassium supplement - f/u in 6 months  Patient verbalized to  me that they understand the following: diagnosis, what is being done for them, what to expect and what should be done at home.  Their questions have been answered.  See after visit summary for patient specific instructions.  Windell Hummingbird PA-C  [facility] 05/20/2018 2:05 PM  Please note: Portions of this report may have been transcribed using dragon voice recognition software. Every effort was made to ensure accuracy; however, inadvertent computerized transcription errors may be present.

## 2018-05-20 NOTE — Patient Instructions (Addendum)
[facility] - [address], [place], [place] [address] Phone: [phone]     IF you received an x-ray today, you will receive an invoice from [facility]. Please contact [facility] at [phone] with questions or concerns regarding your invoice.   IF you received labwork today, you will receive an invoice from [facility]. Please contact [facility] at [phone] with questions or concerns regarding your invoice.   Our billing staff will not be able to assist you with questions regarding bills from these companies.  You will be contacted with the lab results as soon as they are available. The fastest way to get your results is to activate your My Chart account. Instructions are located on the last page of this paperwork. If you have not heard from us regarding the results in 2 weeks, please contact this office.     

## 2018-05-21 LAB — CMP14+EGFR
ALBUMIN: 4.4 g/dL (ref 3.6–4.8)
ALT: 21 IU/L (ref 0–32)
AST: 24 IU/L (ref 0–40)
Albumin/Globulin Ratio: 1.4 (ref 1.2–2.2)
Alkaline Phosphatase: 73 IU/L (ref 39–117)
BUN / CREAT RATIO: 21 (ref 12–28)
BUN: 20 mg/dL (ref 8–27)
Bilirubin Total: 0.3 mg/dL (ref 0.0–1.2)
CO2: 26 mmol/L (ref 20–29)
CREATININE: 0.97 mg/dL (ref 0.57–1.00)
Calcium: 9.7 mg/dL (ref 8.7–10.3)
Chloride: 100 mmol/L (ref 96–106)
GFR calc non Af Amer: 62 mL/min/{1.73_m2} (ref >59)
GFR, EST AFRICAN AMERICAN: 71 mL/min/{1.73_m2} (ref >59)
GLUCOSE: 95 mg/dL (ref 65–99)
Globulin, Total: 3.1 g/dL (ref 1.5–4.5)
Potassium: 3.2 mmol/L — ABNORMAL LOW (ref 3.5–5.2)
Sodium: 141 mmol/L (ref 134–144)
TOTAL PROTEIN: 7.5 g/dL (ref 6.0–8.5)

## 2018-05-21 LAB — LIPID PANEL
CHOL/HDL RATIO: 4.3 ratio (ref 0.0–4.4)
Cholesterol, Total: 202 mg/dL — ABNORMAL HIGH (ref 100–199)
HDL: 47 mg/dL (ref >39)
LDL CALC: 117 mg/dL — AB (ref 0–99)
Triglycerides: 190 mg/dL — ABNORMAL HIGH (ref 0–149)
VLDL CHOLESTEROL CAL: 38 mg/dL (ref 5–40)

## 2018-05-22 MED ORDER — POTASSIUM CHLORIDE CRYS ER 20 MEQ PO TBCR: 20.0000 meq | EXTENDED_RELEASE_TABLET | Freq: Two times a day (BID) | ORAL | 1 refills | Status: DC

## 2018-05-22 NOTE — Addendum Note (Signed)
Addended by: Mancel Bale on: 05/22/2018 01:17 PM   Modules accepted: Orders

## 2018-05-23 DIAGNOSIS — I10 Essential (primary) hypertension: Secondary | ICD-10-CM | POA: Diagnosis not present

## 2018-05-23 NOTE — Addendum Note (Signed)
Addended by: Ileana Roup on: 05/23/2018 09:39 AM   Modules accepted: Orders

## 2018-05-24 LAB — MAGNESIUM: MAGNESIUM: 2 mg/dL (ref 1.6–2.3)

## 2018-05-29 ENCOUNTER — Telehealth: Payer: Self-pay | Admitting: Physician Assistant

## 2018-05-29 NOTE — Telephone Encounter (Signed)
Patient called for lab results- patient notified of lab results- she will increase her K+ dose as directed and start Mg as directed. She has follow up in January ans she just wants to make sure she does not need to follow up with labs before that.

## 2018-06-20 ENCOUNTER — Telehealth: Payer: Self-pay | Admitting: Physician Assistant

## 2018-06-20 NOTE — Telephone Encounter (Signed)
Called and left VM for pt to call the office and reschedule their appt due to [facility] being gone. When pt calls back, please schedule them with a provider of their choice.

## 2018-09-26 ENCOUNTER — Telehealth: Payer: Self-pay | Admitting: Family Medicine

## 2018-09-26 NOTE — Telephone Encounter (Signed)
**  LVM FOR PT TO CALL BACK AND RESCHEDULE DUE TO PROVIDER BEING OUT OF OFFICE ON 11/21/18**

## 2018-11-21 ENCOUNTER — Ambulatory Visit: Payer: BLUE CROSS/BLUE SHIELD | Admitting: Physician Assistant

## 2018-11-21 ENCOUNTER — Ambulatory Visit: Payer: BLUE CROSS/BLUE SHIELD | Admitting: Family Medicine

## 2018-12-08 ENCOUNTER — Other Ambulatory Visit: Payer: Self-pay | Admitting: General Practice

## 2018-12-08 ENCOUNTER — Other Ambulatory Visit: Payer: Self-pay | Admitting: *Deleted

## 2018-12-08 DIAGNOSIS — I10 Essential (primary) hypertension: Secondary | ICD-10-CM

## 2018-12-08 MED ORDER — CHLORTHALIDONE 50 MG PO TABS: 50.0000 mg | ORAL_TABLET | Freq: Every day | ORAL | 0 refills | Status: DC

## 2018-12-08 MED ORDER — LISINOPRIL 40 MG PO TABS: 40.0000 mg | ORAL_TABLET | Freq: Every day | ORAL | 0 refills | Status: DC

## 2018-12-08 NOTE — Telephone Encounter (Signed)
Copied from [place] [phone]. Topic: Quick Communication - See Telephone Encounter >> Dec 08, 2018  8:06 AM Conception Chancy, NT wrote: CRM for notification. See Telephone encounter for: 12/08/18.  Patient is calling and requesting a refill on lisinopril (PRINIVIL,ZESTRIL) 40 MG tablet  and  chlorthalidone (HYGROTON) 50 MG tablet. Patient states she is leaving out of town on 12/11/18.    [id number] [place], [place] - [address] [place] [address] Phone: [phone] Fax: [phone]

## 2018-12-08 NOTE — Telephone Encounter (Signed)
Requested medication (s) are due for refill today: Yes  Requested medication (s) are on the active medication list: Yes  Last refill:  05/20/18  Future visit scheduled: No  Notes to clinic:  Unable to refill per protocol due to last refilled by another provider.     Requested Prescriptions  Pending Prescriptions Disp Refills   chlorthalidone (HYGROTON) 50 MG tablet 90 tablet 1    Sig: Take 1 tablet (50 mg total) by mouth daily.     Cardiovascular: Diuretics - Thiazide Failed - 12/08/2018  1:08 PM      Failed - K in normal range and within 360 days    Potassium  Date Value Ref Range Status  05/20/2018 3.2 (L) 3.5 - 5.2 mmol/L Final         Failed - Valid encounter within last 6 months    Recent Outpatient Visits          6 months ago Essential hypertension   [facility], Damaris Hippo, PA-C   10 months ago Essential hypertension   [facility], Damaris Hippo, PA-C   1 year ago Essential hypertension   [facility], Damaris Hippo, PA-C   1 year ago Essential hypertension   [facility], Damaris Hippo, PA-C   1 year ago Essential hypertension   [facility], Damaris Hippo, PA-C             [place] in normal range and within 360 days    Calcium  Date Value Ref Range Status  05/20/2018 9.7 8.7 - 10.3 mg/dL Final         Passed - Cr in normal range and within 360 days    Creat  Date Value Ref Range Status  12/13/2015 0.74 0.50 - 0.99 mg/dL Final   Creatinine, Ser  Date Value Ref Range Status  05/20/2018 0.97 0.57 - 1.00 mg/dL Final         Passed - Na in normal range and within 360 days    Sodium  Date Value Ref Range Status  05/20/2018 141 134 - 144 mmol/L Final         Passed - Last BP in normal range    BP Readings from Last 1 Encounters:  05/20/18 130/72        lisinopril (PRINIVIL,ZESTRIL) 40 MG tablet 90 tablet 1    Sig: Take 1 tablet (40 mg total) by mouth daily.     Cardiovascular:  ACE  Inhibitors Failed - 12/08/2018  1:08 PM      Failed - Cr in normal range and within 180 days    Creat  Date Value Ref Range Status  12/13/2015 0.74 0.50 - 0.99 mg/dL Final   Creatinine, Ser  Date Value Ref Range Status  05/20/2018 0.97 0.57 - 1.00 mg/dL Final         Failed - K in normal range and within 180 days    Potassium  Date Value Ref Range Status  05/20/2018 3.2 (L) 3.5 - 5.2 mmol/L Final         Failed - Valid encounter within last 6 months    Recent Outpatient Visits          6 months ago Essential hypertension   [facility], Damaris Hippo, PA-C   10 months ago Essential hypertension   [facility] [place], PA-C   1 year ago Essential  hypertension   [facility] at [place], PA-C   1 year ago Essential hypertension   [facility] [place], PA-C   1 year ago Essential hypertension   [facility] at [place], [place]             Passed - Patient is not pregnant      Passed - Last BP in normal range    BP Readings from Last 1 Encounters:  05/20/18 130/72

## 2018-12-08 NOTE — Telephone Encounter (Signed)
Patient called, left VM to return call to the office to schedule an appointment with a new provider, if she chooses to stay at [facility], since Windell Hummingbird is no longer at [place].

## 2019-01-05 ENCOUNTER — Other Ambulatory Visit: Payer: Self-pay | Admitting: Family Medicine

## 2019-01-05 DIAGNOSIS — I10 Essential (primary) hypertension: Secondary | ICD-10-CM

## 2019-01-05 NOTE — Telephone Encounter (Signed)
Pt given 4 pills until appt 30 day courtesy refill already given  . Requested Prescriptions  Pending Prescriptions Disp Refills  . lisinopril (PRINIVIL,ZESTRIL) 40 MG tablet [Pharmacy Med Name: LISINOPRIL 40 MG TABLET] 4 tablet 0    Sig: TAKE 1 TABLET BY MOUTH EVERY DAY     Cardiovascular:  ACE Inhibitors Failed - 01/05/2019  2:23 AM      Failed - Cr in normal range and within 180 days    Creat  Date Value Ref Range Status  12/13/2015 0.74 0.50 - 0.99 mg/dL Final   Creatinine, Ser  Date Value Ref Range Status  05/20/2018 0.97 0.57 - 1.00 mg/dL Final         Failed - K in normal range and within 180 days    Potassium  Date Value Ref Range Status  05/20/2018 3.2 (L) 3.5 - 5.2 mmol/L Final         Failed - Valid encounter within last 6 months    Recent Outpatient Visits          7 months ago Essential hypertension   [facility], Damaris Hippo, PA-C   11 months ago Essential hypertension   [facility] at [place], PA-C   1 year ago Essential hypertension   [facility], Damaris Hippo, PA-C   1 year ago Essential hypertension   [facility], Damaris Hippo, PA-C   1 year ago Essential hypertension   [facility], Damaris Hippo, PA-C      Future Appointments            In 3 days Horald Pollen, MD [facility] [place], [place] - Patient is not pregnant      Passed - Last BP in normal range    BP Readings from Last 1 Encounters:  05/20/18 130/72       . chlorthalidone (HYGROTON) 50 MG tablet [Pharmacy Med Name: CHLORTHALIDONE 50 MG TABLET] 4 tablet 0    Sig: TAKE 1 TABLET BY MOUTH EVERY DAY     Cardiovascular: Diuretics - Thiazide Failed - 01/05/2019  2:23 AM      Failed - K in normal range and within 360 days    Potassium  Date Value Ref Range Status  05/20/2018 3.2 (L) 3.5 - 5.2 mmol/L Final         Failed - Valid encounter within last 6 months    Recent Outpatient Visits          7  months ago Essential hypertension   [facility], Damaris Hippo, PA-C   11 months ago Essential hypertension   [facility], Damaris Hippo, PA-C   1 year ago Essential hypertension   [facility], Damaris Hippo, PA-C   1 year ago Essential hypertension   [facility], Damaris Hippo, PA-C   1 year ago Essential hypertension   [facility], Damaris Hippo, PA-C      Future Appointments            In 3 days Sagardia, Ines Bloomer, MD [facility] [place], [place] in normal range and within 360 days    Calcium  Date Value Ref Range Status  05/20/2018 9.7 8.7 - 10.3 mg/dL Final  Passed - Cr in normal range and within 360 days    Creat  Date Value Ref Range Status  12/13/2015 0.74 0.50 - 0.99 mg/dL Final   Creatinine, Ser  Date Value Ref Range Status  05/20/2018 0.97 0.57 - 1.00 mg/dL Final         Passed - Na in normal range and within 360 days    Sodium  Date Value Ref Range Status  05/20/2018 141 134 - 144 mmol/L Final         Passed - Last BP in normal range    BP Readings from Last 1 Encounters:  05/20/18 130/72

## 2019-01-08 ENCOUNTER — Other Ambulatory Visit: Payer: Self-pay

## 2019-01-08 ENCOUNTER — Encounter

## 2019-01-08 ENCOUNTER — Encounter: Payer: Self-pay | Admitting: Emergency Medicine

## 2019-01-08 ENCOUNTER — Ambulatory Visit: Payer: BLUE CROSS/BLUE SHIELD | Admitting: Emergency Medicine

## 2019-01-08 VITALS — BP 112/65 | HR 79 | Temp 98.4°F | Resp 18 | Ht 65.16 in | Wt 165.2 lb

## 2019-01-08 DIAGNOSIS — E7800 Pure hypercholesterolemia, unspecified: Secondary | ICD-10-CM | POA: Diagnosis not present

## 2019-01-08 DIAGNOSIS — Z1239 Encounter for other screening for malignant neoplasm of breast: Secondary | ICD-10-CM | POA: Diagnosis not present

## 2019-01-08 DIAGNOSIS — I700 Atherosclerosis of aorta: Secondary | ICD-10-CM

## 2019-01-08 DIAGNOSIS — Z13820 Encounter for screening for osteoporosis: Secondary | ICD-10-CM

## 2019-01-08 DIAGNOSIS — I10 Essential (primary) hypertension: Secondary | ICD-10-CM | POA: Diagnosis not present

## 2019-01-08 NOTE — Patient Instructions (Addendum)
   If you have lab work done today you will be contacted with your lab results within the next 2 weeks.  If you have not heard from us then please contact us. The fastest way to get your results is to register for My Chart.   IF you received an x-ray today, you will receive an invoice from [facility]. Please contact [facility] at [phone] with questions or concerns regarding your invoice.   IF you received labwork today, you will receive an invoice from [facility]. Please contact [facility] at [phone] with questions or concerns regarding your invoice.   Our billing staff will not be able to assist you with questions regarding bills from these companies.  You will be contacted with the lab results as soon as they are available. The fastest way to get your results is to activate your My Chart account. Instructions are located on the last page of this paperwork. If you have not heard from us regarding the results in 2 weeks, please contact this office.       Hypertension Hypertension, commonly called high blood pressure, is when the force of blood pumping through the arteries is too strong. The arteries are the blood vessels that carry blood from the heart throughout the body. Hypertension forces the heart to work harder to pump blood and may cause arteries to become narrow or stiff. Having untreated or uncontrolled hypertension can cause heart attacks, strokes, kidney disease, and other problems. A blood pressure reading consists of a higher number over a lower number. Ideally, your blood pressure should be below 120/80. The first ("top") number is called the systolic pressure. It is a measure of the pressure in your arteries as your heart beats. The second ("bottom") number is called the diastolic pressure. It is a measure of the pressure in your arteries as the heart relaxes. What are the causes? The cause of this condition is not known. What increases the  risk? Some risk factors for high blood pressure are under your control. Others are not. Factors you can change  Smoking.  Having type 2 diabetes mellitus, high cholesterol, or both.  Not getting enough exercise or physical activity.  Being overweight.  Having too much fat, sugar, calories, or salt (sodium) in your diet.  Drinking too much alcohol. Factors that are difficult or impossible to change  Having chronic kidney disease.  Having a family history of high blood pressure.  Age. Risk increases with age.  Race. You may be at higher risk if you are African-American.  Gender. Men are at higher risk than women before age 45. After age 65, women are at higher risk than men.  Having obstructive sleep apnea.  Stress. What are the signs or symptoms? Extremely high blood pressure (hypertensive crisis) may cause:  Headache.  Anxiety.  Shortness of breath.  Nosebleed.  Nausea and vomiting.  Severe chest pain.  Jerky movements you cannot control (seizures). How is this diagnosed? This condition is diagnosed by measuring your blood pressure while you are seated, with your arm resting on a surface. The cuff of the blood pressure monitor will be placed directly against the skin of your upper arm at the level of your heart. It should be measured at least twice using the same arm. Certain conditions can cause a difference in blood pressure between your right and left arms. Certain factors can cause blood pressure readings to be lower or higher than normal (elevated) for a short period of time:    When your blood pressure is higher when you are in a health care provider's office than when you are at home, this is called white coat hypertension. Most people with this condition do not need medicines.  When your blood pressure is higher at home than when you are in a health care provider's office, this is called masked hypertension. Most people with this condition may need medicines  to control blood pressure. If you have a high blood pressure reading during one visit or you have normal blood pressure with other risk factors:  You may be asked to return on a different day to have your blood pressure checked again.  You may be asked to monitor your blood pressure at home for 1 week or longer. If you are diagnosed with hypertension, you may have other blood or imaging tests to help your health care provider understand your overall risk for other conditions. How is this treated? This condition is treated by making healthy lifestyle changes, such as eating healthy foods, exercising more, and reducing your alcohol intake. Your health care provider may prescribe medicine if lifestyle changes are not enough to get your blood pressure under control, and if:  Your systolic blood pressure is above 130.  Your diastolic blood pressure is above 80. Your personal target blood pressure may vary depending on your medical conditions, your age, and other factors. Follow these instructions at home: Eating and drinking   Eat a diet that is high in fiber and potassium, and low in sodium, added sugar, and fat. An example eating plan is called the DASH (Dietary Approaches to Stop Hypertension) diet. To eat this way: ? Eat plenty of fresh fruits and vegetables. Try to fill half of your plate at each meal with fruits and vegetables. ? Eat whole grains, such as whole wheat pasta, brown rice, or whole grain bread. Fill about one quarter of your plate with whole grains. ? Eat or drink low-fat dairy products, such as skim milk or low-fat yogurt. ? Avoid fatty cuts of meat, processed or cured meats, and poultry with skin. Fill about one quarter of your plate with lean proteins, such as fish, chicken without skin, beans, eggs, and tofu. ? Avoid premade and processed foods. These tend to be higher in sodium, added sugar, and fat.  Reduce your daily sodium intake. Most people with hypertension should  eat less than 1,500 mg of sodium a day.  Limit alcohol intake to no more than 1 drink a day for nonpregnant women and 2 drinks a day for men. One drink equals 12 oz of beer, 5 oz of wine, or 1 oz of hard liquor. Lifestyle   Work with your health care provider to maintain a healthy body weight or to lose weight. Ask what an ideal weight is for you.  Get at least 30 minutes of exercise that causes your heart to beat faster (aerobic exercise) most days of the week. Activities may include walking, swimming, or biking.  Include exercise to strengthen your muscles (resistance exercise), such as pilates or lifting weights, as part of your weekly exercise routine. Try to do these types of exercises for 30 minutes at least 3 days a week.  Do not use any products that contain nicotine or tobacco, such as cigarettes and e-cigarettes. If you need help quitting, ask your health care provider.  Monitor your blood pressure at home as told by your health care provider.  Keep all follow-up visits as told by your health care provider.   This is important. Medicines  Take over-the-counter and prescription medicines only as told by your health care provider. Follow directions carefully. Blood pressure medicines must be taken as prescribed.  Do not skip doses of blood pressure medicine. Doing this puts you at risk for problems and can make the medicine less effective.  Ask your health care provider about side effects or reactions to medicines that you should watch for. Contact a health care provider if:  You think you are having a reaction to a medicine you are taking.  You have headaches that keep coming back (recurring).  You feel dizzy.  You have swelling in your ankles.  You have trouble with your vision. Get help right away if:  You develop a severe headache or confusion.  You have unusual weakness or numbness.  You feel faint.  You have severe pain in your chest or abdomen.  You vomit  repeatedly.  You have trouble breathing. Summary  Hypertension is when the force of blood pumping through your arteries is too strong. If this condition is not controlled, it may put you at risk for serious complications.  Your personal target blood pressure may vary depending on your medical conditions, your age, and other factors. For most people, a normal blood pressure is less than 120/80.  Hypertension is treated with lifestyle changes, medicines, or a combination of both. Lifestyle changes include weight loss, eating a healthy, low-sodium diet, exercising more, and limiting alcohol. This information is not intended to replace advice given to you by your health care provider. Make sure you discuss any questions you have with your health care provider. Document Released: 10/15/2005 Document Revised: 09/12/2016 Document Reviewed: 09/12/2016 Elsevier Interactive Patient Education  2019 Elsevier Inc.  

## 2019-01-08 NOTE — Progress Notes (Signed)
Jacqueline Thompson 63 y.o.   Chief Complaint  Patient presents with   Hypertension    HISTORY OF PRESENT ILLNESS: This is a 63 y.o. female with history of hypertension here for follow-up.  Doing well.  Has no complaints or medical concerns.  BP Readings from Last 3 Encounters:  01/08/19 112/65  05/20/18 130/72  02/04/18 128/62     HPI   Prior to Admission medications   Medication Sig Start Date End Date Taking? Authorizing Provider  amLODipine (NORVASC) 10 MG tablet Take 1 tablet (10 mg total) by mouth daily. 05/20/18  Yes Weber, Sarah L, PA-C  chlorthalidone (HYGROTON) 50 MG tablet TAKE 1 TABLET BY MOUTH EVERY DAY 01/05/19  Yes Rutherford Guys, MD  lisinopril (PRINIVIL,ZESTRIL) 40 MG tablet TAKE 1 TABLET BY MOUTH EVERY DAY 01/05/19  Yes Rutherford Guys, MD  potassium chloride SA (KLOR-CON M20) 20 MEQ tablet Take 1 tablet (20 mEq total) by mouth 2 (two) times daily. 05/22/18  Yes Weber, Sarah L, PA-C  albuterol (PROVENTIL HFA;VENTOLIN HFA) 108 (90 Base) MCG/ACT inhaler Inhale 2 puffs into the lungs every 4 (four) hours as needed for wheezing or shortness of breath (cough, shortness of breath or wheezing.). Patient not taking: Reported on 01/08/2019 12/14/16   Mancel Bale, PA-C  fluticasone [facility]) 50 MCG/ACT nasal spray Place 2 sprays into both nostrils daily. Patient not taking: Reported on 01/08/2019 02/04/18   Mancel Bale, PA-C    No Known Allergies  Patient Active Problem List   Diagnosis Date Noted   Elevated LDL cholesterol level 05/20/2018   Hepatitis C antibody test positive 04/26/2015   Wheezing 02/22/2015   Essential hypertension 02/22/2015    Past Medical History:  Diagnosis Date   Allergy    Hypertension     Past Surgical History:  Procedure Laterality Date   ABDOMINAL HYSTERECTOMY  2007   Birth Mark Removed from Face Left     Social History   Socioeconomic History   Marital status: Legally Separated    Spouse name: Not on file   Number of  children: 0   Years of education: Not on file   Highest education level: Not on file  Occupational History   Occupation: Surveyor, minerals  Social Needs   Financial resource strain: Not on file   Food insecurity:    Worry: Not on file    Inability: Not on file   Transportation needs:    Medical: Not on file    Non-medical: Not on file  Tobacco Use   Smoking status: Former Smoker    Years: 10.00    Last attempt to quit: 11/29/1997    Years since quitting: 21.1   Smokeless tobacco: Never Used   Tobacco comment: smoked a pack per week  Substance and Sexual Activity   Alcohol use: Yes    Alcohol/week: 4.0 standard drinks    Types: 3 Glasses of wine, 1 Shots of liquor per week    Comment: 2 drinks a week   Drug use: No   Sexual activity: Yes    Partners: Male    Birth control/protection: None  Lifestyle   Physical activity:    Days per week: Not on file    Minutes per session: Not on file   Stress: Not on file  Relationships   Social connections:    Talks on phone: Not on file    Gets together: Not on file    Attends religious service: Not on file  Active member of club or organization: Not on file    Attends meetings of clubs or organizations: Not on file    Relationship status: Not on file   Intimate partner violence:    Fear of current or ex partner: Not on file    Emotionally abused: Not on file    Physically abused: Not on file    Forced sexual activity: Not on file  Other Topics Concern   Not on file  Social History Narrative   Marital status: married   Children: none   Lives with: husband           Seatbelt: 100%   Guns in home: no         Exercise - active at work   Sleep - good    Family History  Problem Relation Age of Onset   Heart disease Mother    Cancer Father        colon,prostate and spine   Stroke Brother    Colon cancer Neg Hx    Esophageal cancer Neg Hx    Pancreatic cancer Neg Hx    Rectal cancer Neg  Hx    Stomach cancer Neg Hx      Review of Systems  Constitutional: Negative.  Negative for chills, fever and weight loss.  HENT: Negative.  Negative for congestion, hearing loss, nosebleeds and sore throat.   Eyes: Negative.  Negative for blurred vision and double vision.  Respiratory: Negative.  Negative for cough and shortness of breath.   Cardiovascular: Negative.  Negative for chest pain and palpitations.  Gastrointestinal: Negative.  Negative for abdominal pain, blood in stool, diarrhea, nausea and vomiting.  Genitourinary: Negative.   Musculoskeletal: Negative.   Skin: Negative.  Negative for rash.  Neurological: Negative.  Negative for dizziness and headaches.  Endo/Heme/Allergies: Negative.   All other systems reviewed and are negative.   Vitals:   01/08/19 1414  BP: 112/65  Pulse: 79  Resp: 18  Temp: 98.4 F (36.9 C)  SpO2: 95%    Physical Exam Vitals signs reviewed.  HENT:     Head: Normocephalic and atraumatic.     Nose: Nose normal.     Mouth/Throat:     Mouth: Mucous membranes are moist.     Pharynx: Oropharynx is clear.  Eyes:     Extraocular Movements: Extraocular movements intact.     Conjunctiva/sclera: Conjunctivae normal.     Pupils: Pupils are equal, round, and reactive to light.  Neck:     Musculoskeletal: Normal range of motion and neck supple.  Cardiovascular:     Rate and Rhythm: Normal rate and regular rhythm.     Pulses: Normal pulses.     Heart sounds: Normal heart sounds.  Pulmonary:     Effort: Pulmonary effort is normal.     Breath sounds: Normal breath sounds.  Abdominal:     Palpations: Abdomen is soft.     Tenderness: There is no abdominal tenderness.  Musculoskeletal: Normal range of motion.  Skin:    General: Skin is warm and dry.     Capillary Refill: Capillary refill takes less than 2 seconds.  Neurological:     General: No focal deficit present.     Mental Status: She is alert.  Psychiatric:        Mood and Affect:  Mood normal.        Behavior: Behavior normal.    A total of 25 minutes was spent in the room with the patient,  greater than 50% of which was in counseling/coordination of care regarding hypertension, treatment, medications, diet and nutrition, prognosis, and need for follow-up.   ASSESSMENT & PLAN: Jacqueline Thompson was seen today for hypertension.  Diagnoses and all orders for this visit:  Essential hypertension -     CBC with Differential/Platelet -     Comprehensive metabolic panel  Atherosclerosis of aorta (HCC)  Elevated LDL cholesterol level -     Hemoglobin A1c -     Lipid panel  Breast cancer screening -     MM Digital Screening; Future  Osteoporosis screening -     DG Bone Density; Future     Patient Instructions       If you have lab work done today you will be contacted with your lab results within the next 2 weeks.  If you have not heard from [place] then please contact us. The fastest way to get your results is to register for My Chart.   IF you received an x-ray today, you will receive an invoice from [facility]. Please contact [facility] at [phone] with questions or concerns regarding your invoice.   IF you received labwork today, you will receive an invoice from [place]. Please contact [facility] at [phone] with questions or concerns regarding your invoice.   Our billing staff will not be able to assist you with questions regarding bills from these companies.  You will be contacted with the lab results as soon as they are available. The fastest way to get your results is to activate your My Chart account. Instructions are located on the last page of this paperwork. If you have not heard from [place] regarding the results in 2 weeks, please contact this office.     Hypertension Hypertension, commonly called high blood pressure, is when the force of blood pumping through the arteries is too strong. The arteries are the blood vessels that carry  blood from the heart throughout the body. Hypertension forces the heart to work harder to pump blood and may cause arteries to become narrow or stiff. Having untreated or uncontrolled hypertension can cause heart attacks, strokes, kidney disease, and other problems. A blood pressure reading consists of a higher number over a lower number. Ideally, your blood pressure should be below 120/80. The first ("top") number is called the systolic pressure. It is a measure of the pressure in your arteries as your heart beats. The second ("bottom") number is called the diastolic pressure. It is a measure of the pressure in your arteries as the heart relaxes. What are the causes? The cause of this condition is not known. What increases the risk? Some risk factors for high blood pressure are under your control. Others are not. Factors you can change  Smoking.  Having type 2 diabetes mellitus, high cholesterol, or both.  Not getting enough exercise or physical activity.  Being overweight.  Having too much fat, sugar, calories, or salt (sodium) in your diet.  Drinking too much alcohol. Factors that are difficult or impossible to change  Having chronic kidney disease.  Having a family history of high blood pressure.  Age. Risk increases with age.  Race. You may be at higher risk if you are African-American.  Gender. Men are at higher risk than women before age 17. After age 57, women are at higher risk than men.  Having obstructive sleep apnea.  Stress. What are the signs or symptoms? Extremely high blood pressure (hypertensive crisis) may cause:  Headache.  Anxiety.  Shortness of breath.  Nosebleed.  Nausea and vomiting.  Severe chest pain.  Jerky movements you cannot control (seizures). How is this diagnosed? This condition is diagnosed by measuring your blood pressure while you are seated, with your arm resting on a surface. The cuff of the blood pressure monitor will be placed  directly against the skin of your upper arm at the level of your heart. It should be measured at least twice using the same arm. Certain conditions can cause a difference in blood pressure between your right and left arms. Certain factors can cause blood pressure readings to be lower or higher than normal (elevated) for a short period of time:  When your blood pressure is higher when you are in a health care provider's office than when you are at home, this is called white coat hypertension. Most people with this condition do not need medicines.  When your blood pressure is higher at home than when you are in a health care provider's office, this is called masked hypertension. Most people with this condition may need medicines to control blood pressure. If you have a high blood pressure reading during one visit or you have normal blood pressure with other risk factors:  You may be asked to return on a different day to have your blood pressure checked again.  You may be asked to monitor your blood pressure at home for 1 week or longer. If you are diagnosed with hypertension, you may have other blood or imaging tests to help your health care provider understand your overall risk for other conditions. How is this treated? This condition is treated by making healthy lifestyle changes, such as eating healthy foods, exercising more, and reducing your alcohol intake. Your health care provider may prescribe medicine if lifestyle changes are not enough to get your blood pressure under control, and if:  Your systolic blood pressure is above 130.  Your diastolic blood pressure is above 80. Your personal target blood pressure may vary depending on your medical conditions, your age, and other factors. Follow these instructions at home: Eating and drinking   Eat a diet that is high in fiber and potassium, and low in sodium, added sugar, and fat. An example eating plan is called the DASH (Dietary Approaches  to Stop Hypertension) diet. To eat this way: ? Eat plenty of fresh fruits and vegetables. Try to fill half of your plate at each meal with fruits and vegetables. ? Eat whole grains, such as whole wheat pasta, brown rice, or whole grain bread. Fill about one quarter of your plate with whole grains. ? Eat or drink low-fat dairy products, such as skim milk or low-fat yogurt. ? Avoid fatty cuts of meat, processed or cured meats, and poultry with skin. Fill about one quarter of your plate with lean proteins, such as fish, chicken without skin, beans, eggs, and tofu. ? Avoid premade and processed foods. These tend to be higher in sodium, added sugar, and fat.  Reduce your daily sodium intake. Most people with hypertension should eat less than 1,500 mg of sodium a day.  Limit alcohol intake to no more than 1 drink a day for nonpregnant women and 2 drinks a day for men. One drink equals 12 oz of beer, 5 oz of wine, or 1 oz of hard liquor. Lifestyle   Work with your health care provider to maintain a healthy body weight or to lose weight. Ask what an ideal weight is for you.  Get at  least 30 minutes of exercise that causes your heart to beat faster (aerobic exercise) most days of the week. Activities may include walking, swimming, or biking.  Include exercise to strengthen your muscles (resistance exercise), such as pilates or lifting weights, as part of your weekly exercise routine. Try to do these types of exercises for 30 minutes at least 3 days a week.  Do not use any products that contain nicotine or tobacco, such as cigarettes and e-cigarettes. If you need help quitting, ask your health care provider.  Monitor your blood pressure at home as told by your health care provider.  Keep all follow-up visits as told by your health care provider. This is important. Medicines  Take over-the-counter and prescription medicines only as told by your health care provider. Follow directions carefully. Blood  pressure medicines must be taken as prescribed.  Do not skip doses of blood pressure medicine. Doing this puts you at risk for problems and can make the medicine less effective.  Ask your health care provider about side effects or reactions to medicines that you should watch for. Contact a health care provider if:  You think you are having a reaction to a medicine you are taking.  You have headaches that keep coming back (recurring).  You feel dizzy.  You have swelling in your ankles.  You have trouble with your vision. Get help right away if:  You develop a severe headache or confusion.  You have unusual weakness or numbness.  You feel faint.  You have severe pain in your chest or abdomen.  You vomit repeatedly.  You have trouble breathing. Summary  Hypertension is when the force of blood pumping through your arteries is too strong. If this condition is not controlled, it may put you at risk for serious complications.  Your personal target blood pressure may vary depending on your medical conditions, your age, and other factors. For most people, a normal blood pressure is less than 120/80.  Hypertension is treated with lifestyle changes, medicines, or a combination of both. Lifestyle changes include weight loss, eating a healthy, low-sodium diet, exercising more, and limiting alcohol. This information is not intended to replace advice given to you by your health care provider. Make sure you discuss any questions you have with your health care provider. Document Released: 10/15/2005 Document Revised: 09/12/2016 Document Reviewed: 09/12/2016 Elsevier Interactive Patient Education  2019 [employer] Inc.      Agustina Caroli, MD [facility]

## 2019-01-09 ENCOUNTER — Encounter: Payer: Self-pay | Admitting: Radiology

## 2019-01-09 LAB — CBC WITH DIFFERENTIAL/PLATELET
Basophils Absolute: 0.1 x10E3/uL (ref 0.0–0.2)
Basos: 1 %
EOS (ABSOLUTE): 0.3 x10E3/uL (ref 0.0–0.4)
Eos: 4 %
Hematocrit: 44 % (ref 34.0–46.6)
Hemoglobin: 14.9 g/dL (ref 11.1–15.9)
Immature Grans (Abs): 0 x10E3/uL (ref 0.0–0.1)
Immature Granulocytes: 0 %
Lymphocytes Absolute: 3.7 x10E3/uL — ABNORMAL HIGH (ref 0.7–3.1)
Lymphs: 47 %
MCH: 30.8 pg (ref 26.6–33.0)
MCHC: 33.9 g/dL (ref 31.5–35.7)
MCV: 91 fL (ref 79–97)
Monocytes Absolute: 0.6 x10E3/uL (ref 0.1–0.9)
Monocytes: 7 %
Neutrophils Absolute: 3.2 x10E3/uL (ref 1.4–7.0)
Neutrophils: 41 %
Platelets: 293 x10E3/uL (ref 150–450)
RBC: 4.84 x10E6/uL (ref 3.77–5.28)
RDW: 13.5 % (ref 11.7–15.4)
WBC: 7.8 x10E3/uL (ref 3.4–10.8)

## 2019-01-09 LAB — COMPREHENSIVE METABOLIC PANEL
ALT: 19 IU/L (ref 0–32)
AST: 22 IU/L (ref 0–40)
Albumin/Globulin Ratio: 1.4 (ref 1.2–2.2)
Albumin: 4.2 g/dL (ref 3.8–4.8)
Alkaline Phosphatase: 72 IU/L (ref 39–117)
BUN / CREAT RATIO: 24 (ref 12–28)
BUN: 24 mg/dL (ref 8–27)
Bilirubin Total: <0.2 mg/dL (ref 0.0–1.2)
CO2: 24 mmol/L (ref 20–29)
Calcium: 9.9 mg/dL (ref 8.7–10.3)
Chloride: 106 mmol/L (ref 96–106)
Creatinine, Ser: 0.98 mg/dL (ref 0.57–1.00)
GFR calc Af Amer: 70 mL/min/{1.73_m2} (ref >59)
GFR calc non Af Amer: 61 mL/min/{1.73_m2} (ref >59)
Globulin, Total: 3.1 g/dL (ref 1.5–4.5)
Glucose: 97 mg/dL (ref 65–99)
Potassium: 3.5 mmol/L (ref 3.5–5.2)
Sodium: 148 mmol/L — ABNORMAL HIGH (ref 134–144)
Total Protein: 7.3 g/dL (ref 6.0–8.5)

## 2019-01-09 LAB — LIPID PANEL
CHOL/HDL RATIO: 4.8 ratio — AB (ref 0.0–4.4)
Cholesterol, Total: 210 mg/dL — ABNORMAL HIGH (ref 100–199)
HDL: 44 mg/dL (ref >39)
LDL Calculated: 136 mg/dL — ABNORMAL HIGH (ref 0–99)
Triglycerides: 152 mg/dL — ABNORMAL HIGH (ref 0–149)
VLDL Cholesterol Cal: 30 mg/dL (ref 5–40)

## 2019-01-09 LAB — HEMOGLOBIN A1C
Est. average glucose Bld gHb Est-mCnc: 120 mg/dL
Hgb A1c MFr Bld: 5.8 % — ABNORMAL HIGH (ref 4.8–5.6)

## 2019-01-13 ENCOUNTER — Telehealth: Payer: Self-pay | Admitting: General Practice

## 2019-01-13 ENCOUNTER — Other Ambulatory Visit: Payer: Self-pay | Admitting: Family Medicine

## 2019-01-13 ENCOUNTER — Other Ambulatory Visit: Payer: Self-pay

## 2019-01-13 DIAGNOSIS — E876 Hypokalemia: Secondary | ICD-10-CM

## 2019-01-13 DIAGNOSIS — I10 Essential (primary) hypertension: Secondary | ICD-10-CM

## 2019-01-13 MED ORDER — POTASSIUM CHLORIDE CRYS ER 20 MEQ PO TBCR: 20.0000 meq | EXTENDED_RELEASE_TABLET | Freq: Two times a day (BID) | ORAL | 0 refills | Status: DC

## 2019-01-13 NOTE — Telephone Encounter (Unsigned)
Copied from [place] [phone]. Topic: Quick Communication - Rx Refill/Question >> Jan 13, 2019  8:24 AM Parke Poisson wrote: Medication:potassium chloride SA (KLOR-CON M20) 20 MEQ tablet 2)lisinopril (PRINIVIL,ZESTRIL) 40 MG tablet 3)chlorthalidone (HYGROTON) 50 MG tablet Has the patient contacted their pharmacy? Yes  (Agent: If yes, when and what did the pharmacy advise?)  Preferred Pharmacy (with phone number or street name): [id number] - [place], [place] - [address] [phone] (Phone) [phone] (Fax)    Agent: Please be advised that RX refills may take up to 3 business days. We ask that you follow-up with your pharmacy.

## 2019-01-13 NOTE — Telephone Encounter (Signed)
Requested Prescriptions  Pending Prescriptions Disp Refills  . chlorthalidone (HYGROTON) 50 MG tablet [Pharmacy Med Name: CHLORTHALIDONE 50 MG TABLET] 90 tablet 1    Sig: TAKE 1 TABLET BY MOUTH EVERY DAY     Cardiovascular: Diuretics - Thiazide Failed - 01/13/2019  8:19 AM      Failed - Na in normal range and within 360 days    Sodium  Date Value Ref Range Status  01/08/2019 148 (H) 134 - 144 mmol/L Final         Passed - Ca in normal range and within 360 days    Calcium  Date Value Ref Range Status  01/08/2019 9.9 8.7 - 10.3 mg/dL Final         Passed - Cr in normal range and within 360 days    Creat  Date Value Ref Range Status  12/13/2015 0.74 0.50 - 0.99 mg/dL Final   Creatinine, Ser  Date Value Ref Range Status  01/08/2019 0.98 0.57 - 1.00 mg/dL Final         Passed - K in normal range and within 360 days    Potassium  Date Value Ref Range Status  01/08/2019 3.5 3.5 - 5.2 mmol/L Final         Passed - Last BP in normal range    BP Readings from Last 1 Encounters:  01/08/19 112/65         Passed - Valid encounter within last 6 months    Recent Outpatient Visits          5 days ago Essential hypertension   [facility] [place], Ines Bloomer, MD   7 months ago Essential hypertension   [facility], Damaris Hippo, PA-C   11 months ago Essential hypertension   [facility], Damaris Hippo, PA-C   1 year ago Essential hypertension   [facility], Damaris Hippo, PA-C   1 year ago Essential hypertension   [facility], Damaris Hippo, PA-C      Future Appointments            In 5 months Sagardia, Ines Bloomer, MD [facility] [place], [facility]         . lisinopril (PRINIVIL,ZESTRIL) 40 MG tablet [Pharmacy Med Name: LISINOPRIL 40 MG TABLET] 4 tablet 0    Sig: TAKE 1 TABLET BY MOUTH EVERY DAY     Cardiovascular:  ACE Inhibitors Passed - 01/13/2019  8:19 AM      Passed - Cr in normal range and within 180 days   Creat  Date Value Ref Range Status  12/13/2015 0.74 0.50 - 0.99 mg/dL Final   Creatinine, Ser  Date Value Ref Range Status  01/08/2019 0.98 0.57 - 1.00 mg/dL Final         Passed - K in normal range and within 180 days    Potassium  Date Value Ref Range Status  01/08/2019 3.5 3.5 - 5.2 mmol/L Final         Passed - Patient is not pregnant      Passed - Last BP in normal range    BP Readings from Last 1 Encounters:  01/08/19 112/65         Passed - Valid encounter within last 6 months    Recent Outpatient Visits          5 days ago Essential hypertension   [facility], Ines Bloomer, MD  7 months ago Essential hypertension   [facility] [place], PA-C   11 months ago Essential hypertension   [facility] [place], PA-C   1 year ago Essential hypertension   [facility], Damaris Hippo, PA-C   1 year ago Essential hypertension   [facility], Damaris Hippo, PA-C      Future Appointments            In 5 months Sagardia, Ines Bloomer, MD [facility] [place], [facility]

## 2019-01-13 NOTE — Telephone Encounter (Signed)
RX sent in

## 2019-01-13 NOTE — Telephone Encounter (Signed)
Copied from [place] [phone]. Topic: Quick Communication - Rx Refill/Question >> Jan 13, 2019  8:24 AM Parke Poisson wrote: Medication:potassium chloride SA (KLOR-CON M20) 20 MEQ tablet 2)lisinopril (PRINIVIL,ZESTRIL) 40 MG tablet 3)chlorthalidone (HYGROTON) 50 MG tablet Has the patient contacted their pharmacy? Yes  (Agent: If yes, when and what did the pharmacy advise?)  Preferred Pharmacy (with phone number or street name): [id number] - [place], [place] - [address] [phone] (Phone) [phone] (Fax)    Agent: Please be advised that RX refills may take up to 3 business days. We ask that you follow-up with your pharmacy.

## 2019-03-31 ENCOUNTER — Telehealth: Payer: Self-pay | Admitting: Emergency Medicine

## 2019-03-31 NOTE — Telephone Encounter (Signed)
Called pt returning her call to reschedule appt in sept because provider not available FR

## 2019-06-10 ENCOUNTER — Other Ambulatory Visit: Payer: Self-pay

## 2019-06-10 DIAGNOSIS — I10 Essential (primary) hypertension: Secondary | ICD-10-CM

## 2019-06-10 MED ORDER — AMLODIPINE BESYLATE 10 MG PO TABS: 10.0000 mg | ORAL_TABLET | Freq: Every day | ORAL | 0 refills | Status: DC

## 2019-07-07 ENCOUNTER — Ambulatory Visit (INDEPENDENT_AMBULATORY_CARE_PROVIDER_SITE_OTHER): Payer: BC Managed Care – PPO | Admitting: Emergency Medicine

## 2019-07-07 ENCOUNTER — Encounter: Payer: Self-pay | Admitting: Emergency Medicine

## 2019-07-07 ENCOUNTER — Other Ambulatory Visit: Payer: Self-pay | Admitting: Emergency Medicine

## 2019-07-07 ENCOUNTER — Other Ambulatory Visit: Payer: Self-pay

## 2019-07-07 VITALS — BP 152/86 | HR 86 | Temp 98.7°F | Resp 16 | Ht 65.0 in | Wt 175.0 lb

## 2019-07-07 DIAGNOSIS — E785 Hyperlipidemia, unspecified: Secondary | ICD-10-CM | POA: Diagnosis not present

## 2019-07-07 DIAGNOSIS — R7303 Prediabetes: Secondary | ICD-10-CM

## 2019-07-07 DIAGNOSIS — I10 Essential (primary) hypertension: Secondary | ICD-10-CM | POA: Diagnosis not present

## 2019-07-07 MED ORDER — CHLORTHALIDONE 50 MG PO TABS: 50.0000 mg | ORAL_TABLET | Freq: Every day | ORAL | 3 refills | Status: DC

## 2019-07-07 MED ORDER — ROSUVASTATIN CALCIUM 10 MG PO TABS: 10.0000 mg | ORAL_TABLET | Freq: Every day | ORAL | 3 refills | Status: DC

## 2019-07-07 MED ORDER — LISINOPRIL 40 MG PO TABS: 40.0000 mg | ORAL_TABLET | Freq: Every day | ORAL | 3 refills | Status: DC

## 2019-07-07 NOTE — Patient Instructions (Addendum)
   If you have lab work done today you will be contacted with your lab results within the next 2 weeks.  If you have not heard from us then please contact us. The fastest way to get your results is to register for My Chart.   IF you received an x-ray today, you will receive an invoice from [facility]. Please contact [facility] at [phone] with questions or concerns regarding your invoice.   IF you received labwork today, you will receive an invoice from [facility]. Please contact [facility] at [phone] with questions or concerns regarding your invoice.   Our billing staff will not be able to assist you with questions regarding bills from these companies.  You will be contacted with the lab results as soon as they are available. The fastest way to get your results is to activate your My Chart account. Instructions are located on the last page of this paperwork. If you have not heard from us regarding the results in 2 weeks, please contact this office.     Hypertension, Adult High blood pressure (hypertension) is when the force of blood pumping through the arteries is too strong. The arteries are the blood vessels that carry blood from the heart throughout the body. Hypertension forces the heart to work harder to pump blood and may cause arteries to become narrow or stiff. Untreated or uncontrolled hypertension can cause a heart attack, heart failure, a stroke, kidney disease, and other problems. A blood pressure reading consists of a higher number over a lower number. Ideally, your blood pressure should be below 120/80. The first ("top") number is called the systolic pressure. It is a measure of the pressure in your arteries as your heart beats. The second ("bottom") number is called the diastolic pressure. It is a measure of the pressure in your arteries as the heart relaxes. What are the causes? The exact cause of this condition is not known. There are some conditions  that result in or are related to high blood pressure. What increases the risk? Some risk factors for high blood pressure are under your control. The following factors may make you more likely to develop this condition:  Smoking.  Having type 2 diabetes mellitus, high cholesterol, or both.  Not getting enough exercise or physical activity.  Being overweight.  Having too much fat, sugar, calories, or salt (sodium) in your diet.  Drinking too much alcohol. Some risk factors for high blood pressure may be difficult or impossible to change. Some of these factors include:  Having chronic kidney disease.  Having a family history of high blood pressure.  Age. Risk increases with age.  Race. You may be at higher risk if you are African American.  Gender. Men are at higher risk than women before age 45. After age 65, women are at higher risk than men.  Having obstructive sleep apnea.  Stress. What are the signs or symptoms? High blood pressure may not cause symptoms. Very high blood pressure (hypertensive crisis) may cause:  Headache.  Anxiety.  Shortness of breath.  Nosebleed.  Nausea and vomiting.  Vision changes.  Severe chest pain.  Seizures. How is this diagnosed? This condition is diagnosed by measuring your blood pressure while you are seated, with your arm resting on a flat surface, your legs uncrossed, and your feet flat on the floor. The cuff of the blood pressure monitor will be placed directly against the skin of your upper arm at the level of your heart.   It should be measured at least twice using the same arm. Certain conditions can cause a difference in blood pressure between your right and left arms. Certain factors can cause blood pressure readings to be lower or higher than normal for a short period of time:  When your blood pressure is higher when you are in a health care provider's office than when you are at home, this is called white coat hypertension.  Most people with this condition do not need medicines.  When your blood pressure is higher at home than when you are in a health care provider's office, this is called masked hypertension. Most people with this condition may need medicines to control blood pressure. If you have a high blood pressure reading during one visit or you have normal blood pressure with other risk factors, you may be asked to:  Return on a different day to have your blood pressure checked again.  Monitor your blood pressure at home for 1 week or longer. If you are diagnosed with hypertension, you may have other blood or imaging tests to help your health care provider understand your overall risk for other conditions. How is this treated? This condition is treated by making healthy lifestyle changes, such as eating healthy foods, exercising more, and reducing your alcohol intake. Your health care provider may prescribe medicine if lifestyle changes are not enough to get your blood pressure under control, and if:  Your systolic blood pressure is above 130.  Your diastolic blood pressure is above 80. Your personal target blood pressure may vary depending on your medical conditions, your age, and other factors. Follow these instructions at home: Eating and drinking   Eat a diet that is high in fiber and potassium, and low in sodium, added sugar, and fat. An example eating plan is called the DASH (Dietary Approaches to Stop Hypertension) diet. To eat this way: ? Eat plenty of fresh fruits and vegetables. Try to fill one half of your plate at each meal with fruits and vegetables. ? Eat whole grains, such as whole-wheat pasta, brown rice, or whole-grain bread. Fill about one fourth of your plate with whole grains. ? Eat or drink low-fat dairy products, such as skim milk or low-fat yogurt. ? Avoid fatty cuts of meat, processed or cured meats, and poultry with skin. Fill about one fourth of your plate with lean proteins, such  as fish, chicken without skin, beans, eggs, or tofu. ? Avoid pre-made and processed foods. These tend to be higher in sodium, added sugar, and fat.  Reduce your daily sodium intake. Most people with hypertension should eat less than 1,500 mg of sodium a day.  Do not drink alcohol if: ? Your health care provider tells you not to drink. ? You are pregnant, may be pregnant, or are planning to become pregnant.  If you drink alcohol: ? Limit how much you use to:  0-1 drink a day for women.  0-2 drinks a day for men. ? Be aware of how much alcohol is in your drink. In the [place] one drink equals one 12 oz bottle of beer (355 mL), one 5 oz glass of wine (148 mL), or one 1 oz glass of hard liquor (44 mL). Lifestyle   Work with your health care provider to maintain a healthy body weight or to lose weight. Ask what an ideal weight is for you.  Get at least 30 minutes of exercise most days of the week. Activities may include walking, swimming, or   biking.  Include exercise to strengthen your muscles (resistance exercise), such as Pilates or lifting weights, as part of your weekly exercise routine. Try to do these types of exercises for 30 minutes at least 3 days a week.  Do not use any products that contain nicotine or tobacco, such as cigarettes, e-cigarettes, and chewing tobacco. If you need help quitting, ask your health care provider.  Monitor your blood pressure at home as told by your health care provider.  Keep all follow-up visits as told by your health care provider. This is important. Medicines  Take over-the-counter and prescription medicines only as told by your health care provider. Follow directions carefully. Blood pressure medicines must be taken as prescribed.  Do not skip doses of blood pressure medicine. Doing this puts you at risk for problems and can make the medicine less effective.  Ask your health care provider about side effects or reactions to medicines that you  should watch for. Contact a health care provider if you:  Think you are having a reaction to a medicine you are taking.  Have headaches that keep coming back (recurring).  Feel dizzy.  Have swelling in your ankles.  Have trouble with your vision. Get help right away if you:  Develop a severe headache or confusion.  Have unusual weakness or numbness.  Feel faint.  Have severe pain in your chest or abdomen.  Vomit repeatedly.  Have trouble breathing. Summary  Hypertension is when the force of blood pumping through your arteries is too strong. If this condition is not controlled, it may put you at risk for serious complications.  Your personal target blood pressure may vary depending on your medical conditions, your age, and other factors. For most people, a normal blood pressure is less than 120/80.  Hypertension is treated with lifestyle changes, medicines, or a combination of both. Lifestyle changes include losing weight, eating a healthy, low-sodium diet, exercising more, and limiting alcohol. This information is not intended to replace advice given to you by your health care provider. Make sure you discuss any questions you have with your health care provider. Document Released: 10/15/2005 Document Revised: 06/25/2018 Document Reviewed: 06/25/2018 [employer] Patient Education  2020 [employer]  

## 2019-07-07 NOTE — Progress Notes (Signed)
Lab Results  Component Value Date   HGBA1C 5.8 (H) 01/08/2019   BP Readings from Last 3 Encounters:  01/08/19 112/65  05/20/18 130/72  02/04/18 128/62   Lab Results  Component Value Date   CHOL 210 (H) 01/08/2019   HDL 44 01/08/2019   LDLCALC 136 (H) 01/08/2019   TRIG 152 (H) 01/08/2019   CHOLHDL 4.8 (H) 01/08/2019   Jacqueline Thompson 63 y.o.   Chief Complaint  Patient presents with  . Hypertension    6 MONTH FOLLOW UP   . Medication Refill    Chlorthalidone and Lisinopril    HISTORY OF PRESENT ILLNESS: This is a 63 y.o. female with history of hypertension here for follow-up and medication refill. 1.  Hypertension: Takes amlodipine 10 mg, chlorthalidone 50 mg, lisinopril 40 mg.  No complaints. 2.  Prediabetes: No medications. 3.  Dyslipidemia: No medications. Has no complaints or medical concerns today.  HPI   Prior to Admission medications   Medication Sig Start Date End Date Taking? Authorizing Provider  albuterol (PROVENTIL HFA;VENTOLIN HFA) 108 (90 Base) MCG/ACT inhaler Inhale 2 puffs into the lungs every 4 (four) hours as needed for wheezing or shortness of breath (cough, shortness of breath or wheezing.). 12/14/16  Yes Weber, Sarah L, PA-C  amLODipine (NORVASC) 10 MG tablet TAKE 1 TABLET BY MOUTH EVERY DAY 07/07/19  Yes Donette Mainwaring, Ines Bloomer, MD  chlorthalidone (HYGROTON) 50 MG tablet TAKE 1 TABLET BY MOUTH EVERY DAY 01/13/19  Yes Jaaziah Schulke, Ines Bloomer, MD  fluticasone [facility]) 50 MCG/ACT nasal spray Place 2 sprays into both nostrils daily. 02/04/18  Yes Weber, Sarah L, PA-C  lisinopril (PRINIVIL,ZESTRIL) 40 MG tablet TAKE 1 TABLET BY MOUTH EVERY DAY 01/13/19  Yes Ladena Jacquez, Ines Bloomer, MD  potassium chloride SA (KLOR-CON M20) 20 MEQ tablet Take 1 tablet (20 mEq total) by mouth 2 (two) times daily. 01/13/19  Yes Rutherford Guys, MD    No Known Allergies  Patient Active Problem List   Diagnosis Date Noted  . Atherosclerosis of aorta ([place]) 01/08/2019  . Elevated LDL  cholesterol level 05/20/2018  . Essential hypertension 02/22/2015    Past Medical History:  Diagnosis Date  . Allergy   . Hypertension     Past Surgical History:  Procedure Laterality Date  . ABDOMINAL HYSTERECTOMY  2007  . Birth Mark Removed from Face Left     Social History   Socioeconomic History  . Marital status: Legally Separated    Spouse name: Not on file  . Number of children: 0  . Years of education: Not on file  . Highest education level: Not on file  Occupational History  . Occupation: distrubution specialist  Social Needs  . Financial resource strain: Not on file  . Food insecurity    Worry: Not on file    Inability: Not on file  . Transportation needs    Medical: Not on file    Non-medical: Not on file  Tobacco Use  . Smoking status: Former Smoker    Years: 10.00    Quit date: 11/29/1997    Years since quitting: 21.6  . Smokeless tobacco: Never Used  . Tobacco comment: smoked a pack per week  Substance and Sexual Activity  . Alcohol use: Yes    Alcohol/week: 4.0 standard drinks    Types: 3 Glasses of wine, 1 Shots of liquor per week    Comment: 2 drinks a week  . Drug use: No  . Sexual activity: Yes    Partners:  Male    Birth control/protection: None  Lifestyle  . Physical activity    Days per week: Not on file    Minutes per session: Not on file  . Stress: Not on file  Relationships  . Social Herbalist on phone: Not on file    Gets together: Not on file    Attends religious service: Not on file    Active member of club or organization: Not on file    Attends meetings of clubs or organizations: Not on file    Relationship status: Not on file  . Intimate partner violence    Fear of current or ex partner: Not on file    Emotionally abused: Not on file    Physically abused: Not on file    Forced sexual activity: Not on file  Other Topics Concern  . Not on file  Social History Narrative   Marital status: married   Children:  none   Lives with: husband           Seatbelt: 100%   Guns in home: no         Exercise - active at work   Sleep - good    Family History  Problem Relation Age of Onset  . Heart disease Mother   . Cancer Father        colon,prostate and spine  . Stroke Brother   . Colon cancer Neg Hx   . Esophageal cancer Neg Hx   . Pancreatic cancer Neg Hx   . Rectal cancer Neg Hx   . Stomach cancer Neg Hx      Review of Systems  Constitutional: Negative.  Negative for chills and fever.  HENT: Negative.  Negative for congestion and sore throat.   Eyes: Negative.  Negative for blurred vision and double vision.  Respiratory: Negative.  Negative for cough and shortness of breath.   Cardiovascular: Negative.  Negative for chest pain and palpitations.  Gastrointestinal: Negative.  Negative for abdominal pain, blood in stool, diarrhea, melena, nausea and vomiting.  Genitourinary: Negative.   Musculoskeletal: Negative.  Negative for myalgias and neck pain.  Skin: Negative.  Negative for rash.  Neurological: Negative.  Negative for dizziness and headaches.  Endo/Heme/Allergies: Negative.   All other systems reviewed and are negative.   Vitals:   07/07/19 1534  BP: (!) 155/80  Pulse: 86  Resp: 16  Temp: 98.7 F (37.1 C)  SpO2: 97%    Physical Exam Vitals signs reviewed.  Constitutional:      Appearance: Normal appearance.  HENT:     Head: Normocephalic.  Eyes:     Extraocular Movements: Extraocular movements intact.     Conjunctiva/sclera: Conjunctivae normal.     Pupils: Pupils are equal, round, and reactive to light.  Neck:     Musculoskeletal: Normal range of motion and neck supple.  Cardiovascular:     Rate and Rhythm: Normal rate and regular rhythm.  Neurological:     Mental Status: She is alert.    A total of 25 minutes was spent in the room with the patient, greater than 50% of which was in counseling/coordination of care regarding chronic medical conditions  including hypertension, prediabetes, and dyslipidemia, review of previous blood results, medication review and starting new statin medication, side effects, diet and nutrition, importance of physical activity, stress control, prognosis and need for follow-up.   ASSESSMENT & PLAN: Nickolette was seen today for hypertension and medication refill.  Diagnoses and all  orders for this visit:  Essential hypertension -     Comprehensive metabolic panel -     chlorthalidone (HYGROTON) 50 MG tablet; Take 1 tablet (50 mg total) by mouth daily. -     lisinopril (ZESTRIL) 40 MG tablet; Take 1 tablet (40 mg total) by mouth daily.  Prediabetes -     Hemoglobin A1c  Dyslipidemia -     Lipid panel -     rosuvastatin (CRESTOR) 10 MG tablet; Take 1 tablet (10 mg total) by mouth daily.    Patient Instructions       If you have lab work done today you will be contacted with your lab results within the next 2 weeks.  If you have not heard from [place] then please contact us. The fastest way to get your results is to register for My Chart.   IF you received an x-ray today, you will receive an invoice from [facility]. Please contact [facility] at [phone] with questions or concerns regarding your invoice.   IF you received labwork today, you will receive an invoice from [place]. Please contact [facility] at [phone] with questions or concerns regarding your invoice.   Our billing staff will not be able to assist you with questions regarding bills from these companies.  You will be contacted with the lab results as soon as they are available. The fastest way to get your results is to activate your My Chart account. Instructions are located on the last page of this paperwork. If you have not heard from [place] regarding the results in 2 weeks, please contact this office.     Hypertension, Adult High blood pressure (hypertension) is when the force of blood pumping through the arteries is  too strong. The arteries are the blood vessels that carry blood from the heart throughout the body. Hypertension forces the heart to work harder to pump blood and may cause arteries to become narrow or stiff. Untreated or uncontrolled hypertension can cause a heart attack, heart failure, a stroke, kidney disease, and other problems. A blood pressure reading consists of a higher number over a lower number. Ideally, your blood pressure should be below 120/80. The first ("top") number is called the systolic pressure. It is a measure of the pressure in your arteries as your heart beats. The second ("bottom") number is called the diastolic pressure. It is a measure of the pressure in your arteries as the heart relaxes. What are the causes? The exact cause of this condition is not known. There are some conditions that result in or are related to high blood pressure. What increases the risk? Some risk factors for high blood pressure are under your control. The following factors may make you more likely to develop this condition:  Smoking.  Having type 2 diabetes mellitus, high cholesterol, or both.  Not getting enough exercise or physical activity.  Being overweight.  Having too much fat, sugar, calories, or salt (sodium) in your diet.  Drinking too much alcohol. Some risk factors for high blood pressure may be difficult or impossible to change. Some of these factors include:  Having chronic kidney disease.  Having a family history of high blood pressure.  Age. Risk increases with age.  Race. You may be at higher risk if you are African American.  Gender. Men are at higher risk than women before age 52. After age 58, women are at higher risk than men.  Having obstructive sleep apnea.  Stress. What are the signs or  symptoms? High blood pressure may not cause symptoms. Very high blood pressure (hypertensive crisis) may cause:  Headache.  Anxiety.  Shortness of breath.  Nosebleed.   Nausea and vomiting.  Vision changes.  Severe chest pain.  Seizures. How is this diagnosed? This condition is diagnosed by measuring your blood pressure while you are seated, with your arm resting on a flat surface, your legs uncrossed, and your feet flat on the floor. The cuff of the blood pressure monitor will be placed directly against the skin of your upper arm at the level of your heart. It should be measured at least twice using the same arm. Certain conditions can cause a difference in blood pressure between your right and left arms. Certain factors can cause blood pressure readings to be lower or higher than normal for a short period of time:  When your blood pressure is higher when you are in a health care provider's office than when you are at home, this is called white coat hypertension. Most people with this condition do not need medicines.  When your blood pressure is higher at home than when you are in a health care provider's office, this is called masked hypertension. Most people with this condition may need medicines to control blood pressure. If you have a high blood pressure reading during one visit or you have normal blood pressure with other risk factors, you may be asked to:  Return on a different day to have your blood pressure checked again.  Monitor your blood pressure at home for 1 week or longer. If you are diagnosed with hypertension, you may have other blood or imaging tests to help your health care provider understand your overall risk for other conditions. How is this treated? This condition is treated by making healthy lifestyle changes, such as eating healthy foods, exercising more, and reducing your alcohol intake. Your health care provider may prescribe medicine if lifestyle changes are not enough to get your blood pressure under control, and if:  Your systolic blood pressure is above 130.  Your diastolic blood pressure is above 80. Your personal target  blood pressure may vary depending on your medical conditions, your age, and other factors. Follow these instructions at home: Eating and drinking   Eat a diet that is high in fiber and potassium, and low in sodium, added sugar, and fat. An example eating plan is called the DASH (Dietary Approaches to Stop Hypertension) diet. To eat this way: ? Eat plenty of fresh fruits and vegetables. Try to fill one half of your plate at each meal with fruits and vegetables. ? Eat whole grains, such as whole-wheat pasta, brown rice, or whole-grain bread. Fill about one fourth of your plate with whole grains. ? Eat or drink low-fat dairy products, such as skim milk or low-fat yogurt. ? Avoid fatty cuts of meat, processed or cured meats, and poultry with skin. Fill about one fourth of your plate with lean proteins, such as fish, chicken without skin, beans, eggs, or tofu. ? Avoid pre-made and processed foods. These tend to be higher in sodium, added sugar, and fat.  Reduce your daily sodium intake. Most people with hypertension should eat less than 1,500 mg of sodium a day.  Do not drink alcohol if: ? Your health care provider tells you not to drink. ? You are pregnant, may be pregnant, or are planning to become pregnant.  If you drink alcohol: ? Limit how much you use to:  0-1 drink a day  for women.  0-2 drinks a day for men. ? Be aware of how much alcohol is in your drink. In the [place] one drink equals one 12 oz bottle of beer (355 mL), one 5 oz glass of wine (148 mL), or one 1 oz glass of hard liquor (44 mL). Lifestyle   Work with your health care provider to maintain a healthy body weight or to lose weight. Ask what an ideal weight is for you.  Get at least 30 minutes of exercise most days of the week. Activities may include walking, swimming, or biking.  Include exercise to strengthen your muscles (resistance exercise), such as Pilates or lifting weights, as part of your weekly exercise routine.  Try to do these types of exercises for 30 minutes at least 3 days a week.  Do not use any products that contain nicotine or tobacco, such as cigarettes, e-cigarettes, and chewing tobacco. If you need help quitting, ask your health care provider.  Monitor your blood pressure at home as told by your health care provider.  Keep all follow-up visits as told by your health care provider. This is important. Medicines  Take over-the-counter and prescription medicines only as told by your health care provider. Follow directions carefully. Blood pressure medicines must be taken as prescribed.  Do not skip doses of blood pressure medicine. Doing this puts you at risk for problems and can make the medicine less effective.  Ask your health care provider about side effects or reactions to medicines that you should watch for. Contact a health care provider if you:  Think you are having a reaction to a medicine you are taking.  Have headaches that keep coming back (recurring).  Feel dizzy.  Have swelling in your ankles.  Have trouble with your vision. Get help right away if you:  Develop a severe headache or confusion.  Have unusual weakness or numbness.  Feel faint.  Have severe pain in your chest or abdomen.  Vomit repeatedly.  Have trouble breathing. Summary  Hypertension is when the force of blood pumping through your arteries is too strong. If this condition is not controlled, it may put you at risk for serious complications.  Your personal target blood pressure may vary depending on your medical conditions, your age, and other factors. For most people, a normal blood pressure is less than 120/80.  Hypertension is treated with lifestyle changes, medicines, or a combination of both. Lifestyle changes include losing weight, eating a healthy, low-sodium diet, exercising more, and limiting alcohol. This information is not intended to replace advice given to you by your health care  provider. Make sure you discuss any questions you have with your health care provider. Document Released: 10/15/2005 Document Revised: 06/25/2018 Document Reviewed: 06/25/2018 Elsevier Patient Education  2020 Elsevier Inc.      Agustina Caroli, MD [facility]

## 2019-07-08 LAB — COMPREHENSIVE METABOLIC PANEL
ALT: 21 IU/L (ref 0–32)
AST: 21 IU/L (ref 0–40)
Albumin/Globulin Ratio: 1.4 (ref 1.2–2.2)
Albumin: 4.4 g/dL (ref 3.8–4.8)
Alkaline Phosphatase: 79 IU/L (ref 39–117)
BUN/Creatinine Ratio: 29 — ABNORMAL HIGH (ref 12–28)
BUN: 24 mg/dL (ref 8–27)
Bilirubin Total: <0.2 mg/dL (ref 0.0–1.2)
CO2: 28 mmol/L (ref 20–29)
Calcium: 9.8 mg/dL (ref 8.7–10.3)
Chloride: 102 mmol/L (ref 96–106)
Creatinine, Ser: 0.84 mg/dL (ref 0.57–1.00)
GFR calc Af Amer: 84 mL/min/{1.73_m2} (ref >59)
GFR calc non Af Amer: 73 mL/min/{1.73_m2} (ref >59)
Globulin, Total: 3.2 g/dL (ref 1.5–4.5)
Glucose: 81 mg/dL (ref 65–99)
Potassium: 3.3 mmol/L — ABNORMAL LOW (ref 3.5–5.2)
Sodium: 144 mmol/L (ref 134–144)
Total Protein: 7.6 g/dL (ref 6.0–8.5)

## 2019-07-08 LAB — HEMOGLOBIN A1C
Est. average glucose Bld gHb Est-mCnc: 114 mg/dL
Hgb A1c MFr Bld: 5.6 % (ref 4.8–5.6)

## 2019-07-08 LAB — LIPID PANEL
Chol/HDL Ratio: 5.2 ratio — ABNORMAL HIGH (ref 0.0–4.4)
Cholesterol, Total: 235 mg/dL — ABNORMAL HIGH (ref 100–199)
HDL: 45 mg/dL (ref >39)
LDL Chol Calc (NIH): 143 mg/dL — ABNORMAL HIGH (ref 0–99)
Triglycerides: 257 mg/dL — ABNORMAL HIGH (ref 0–149)
VLDL Cholesterol Cal: 47 mg/dL — ABNORMAL HIGH (ref 5–40)

## 2019-07-10 ENCOUNTER — Ambulatory Visit: Payer: BLUE CROSS/BLUE SHIELD | Admitting: Emergency Medicine

## 2019-09-15 ENCOUNTER — Telehealth: Payer: Self-pay | Admitting: Emergency Medicine

## 2019-09-15 NOTE — Telephone Encounter (Signed)
Called to reschedule appt with sagardia due to provider not being in office on day of visit   Number is not in service

## 2019-10-09 ENCOUNTER — Ambulatory Visit (INDEPENDENT_AMBULATORY_CARE_PROVIDER_SITE_OTHER): Payer: BC Managed Care – PPO | Admitting: Otolaryngology

## 2019-10-09 ENCOUNTER — Encounter (INDEPENDENT_AMBULATORY_CARE_PROVIDER_SITE_OTHER): Payer: Self-pay | Admitting: Otolaryngology

## 2019-10-09 ENCOUNTER — Other Ambulatory Visit: Payer: Self-pay

## 2019-10-09 VITALS — Temp 98.1°F

## 2019-10-09 DIAGNOSIS — D1039 Benign neoplasm of other parts of mouth: Secondary | ICD-10-CM

## 2019-10-09 NOTE — Progress Notes (Signed)
HPI: Jacqueline Thompson is a 63 y.o. female who presents is referred by her dentist for evaluation of an abnormality noted on the palate mucosa on the right side.  I was sent a picture of this.  Patient was having some recent dental work and noticed this abnormality on the right side of the palate.  Patient was unaware of this and is otherwise asymptomatic.Marland Kitchen  Past Medical History:  Diagnosis Date  . Allergy   . Hypertension    Past Surgical History:  Procedure Laterality Date  . ABDOMINAL HYSTERECTOMY  2007  . Birth Mark Removed from Face Left    Social History   Socioeconomic History  . Marital status: Legally Separated    Spouse name: Not on file  . Number of children: 0  . Years of education: Not on file  . Highest education level: Not on file  Occupational History  . Occupation: distrubution specialist  Tobacco Use  . Smoking status: Former Smoker    Packs/day: 0.75    Years: 10.00    Pack years: 7.50    Start date: 1989    Quit date: 11/29/1997    Years since quitting: 21.8  . Smokeless tobacco: Never Used  . Tobacco comment: smoked a pack per week  Substance and Sexual Activity  . Alcohol use: Yes    Alcohol/week: 4.0 standard drinks    Types: 3 Glasses of wine, 1 Shots of liquor per week    Comment: 2 drinks a week  . Drug use: No  . Sexual activity: Yes    Partners: Male    Birth control/protection: None  Other Topics Concern  . Not on file  Social History Narrative   Marital status: married   Children: none   Lives with: husband           Seatbelt: 100%   Guns in home: no         Exercise - active at work   Sleep - good   Social Determinants of Health   Financial Resource Strain:   . Difficulty of Paying Living Expenses: Not on file  Food Insecurity:   . Worried About Charity fundraiser in the Last Year: Not on file  . Ran Out of Food in the Last Year: Not on file  Transportation Needs:   . Lack of Transportation (Medical): Not on file  . Lack of  Transportation (Non-Medical): Not on file  Physical Activity:   . Days of Exercise per Week: Not on file  . Minutes of Exercise per Session: Not on file  Stress:   . Feeling of Stress : Not on file  Social Connections:   . Frequency of Communication with Friends and Family: Not on file  . Frequency of Social Gatherings with Friends and Family: Not on file  . Attends Religious Services: Not on file  . Active Member of Clubs or Organizations: Not on file  . Attends Archivist Meetings: Not on file  . Marital Status: Not on file   Family History  Problem Relation Age of Onset  . Heart disease Mother   . Cancer Father        colon,prostate and spine  . Stroke Brother   . Colon cancer Neg Hx   . Esophageal cancer Neg Hx   . Pancreatic cancer Neg Hx   . Rectal cancer Neg Hx   . Stomach cancer Neg Hx    No Known Allergies Prior to Admission medications   Medication  Sig Start Date End Date Taking? Authorizing Provider  albuterol (PROVENTIL HFA;VENTOLIN HFA) 108 (90 Base) MCG/ACT inhaler Inhale 2 puffs into the lungs every 4 (four) hours as needed for wheezing or shortness of breath (cough, shortness of breath or wheezing.). 12/14/16  Yes Weber, Sarah L, PA-C  amLODipine (NORVASC) 10 MG tablet TAKE 1 TABLET BY MOUTH EVERY DAY 07/07/19  Yes Sagardia, Ines Bloomer, MD  fluticasone [facility]) 50 MCG/ACT nasal spray Place 2 sprays into both nostrils daily. 02/04/18  Yes Weber, Sarah L, PA-C  potassium chloride SA (KLOR-CON M20) 20 MEQ tablet Take 1 tablet (20 mEq total) by mouth 2 (two) times daily. 01/13/19  Yes Rutherford Guys, MD  rosuvastatin (CRESTOR) 10 MG tablet Take 1 tablet (10 mg total) by mouth daily. 07/07/19  Yes Sagardia, Ines Bloomer, MD  chlorthalidone (HYGROTON) 50 MG tablet Take 1 tablet (50 mg total) by mouth daily. 07/07/19 10/05/19  Horald Pollen, MD  lisinopril (ZESTRIL) 40 MG tablet Take 1 tablet (40 mg total) by mouth daily. 07/07/19 10/05/19  Horald Pollen, MD      Positive ROS: Otherwise negative  All other systems have been reviewed and were otherwise negative with the exception of those mentioned in the HPI and as above.  Physical Exam: Constitutional: Alert, well-appearing, no acute distress Ears: External ears without lesions or tenderness. Ear canals are clear bilaterally with intact, clear TMs.  Nasal: External nose without lesions. Septum mild deformity. Clear nasal passages Oral: Lips and gums without lesions. Tongue and palate mucosa without lesions. Posterior oropharynx clear.  The small lesion on the right side of the soft palate represents a small papilloma measuring approximately 3 to 4 mm in size and on clinical exam is consistent with a benign papilloma.  No evidence of neoplasia. Indirect laryngoscopy revealed a clear base of tongue vallecula epiglottis, hypopharynx and larynx. Neck: No palpable adenopathy or masses Respiratory: Breathing comfortably  Skin: No facial/neck lesions or rash noted.  Procedures  Assessment: Benign squamous papilloma  Plan: No further therapy needed unless it enlarges or becomes symptomatic.   Radene Journey, MD   CC:

## 2020-01-08 ENCOUNTER — Ambulatory Visit: Payer: BC Managed Care – PPO | Admitting: Emergency Medicine

## 2020-01-14 ENCOUNTER — Other Ambulatory Visit: Payer: Self-pay

## 2020-01-14 ENCOUNTER — Ambulatory Visit: Payer: BC Managed Care – PPO | Admitting: Emergency Medicine

## 2020-01-14 ENCOUNTER — Encounter: Payer: Self-pay | Admitting: Emergency Medicine

## 2020-01-14 VITALS — BP 156/82 | HR 79 | Temp 97.9°F | Resp 16 | Ht 65.0 in | Wt 151.0 lb

## 2020-01-14 DIAGNOSIS — Z13820 Encounter for screening for osteoporosis: Secondary | ICD-10-CM

## 2020-01-14 DIAGNOSIS — I10 Essential (primary) hypertension: Secondary | ICD-10-CM | POA: Diagnosis not present

## 2020-01-14 DIAGNOSIS — E785 Hyperlipidemia, unspecified: Secondary | ICD-10-CM

## 2020-01-14 DIAGNOSIS — R062 Wheezing: Secondary | ICD-10-CM | POA: Diagnosis not present

## 2020-01-14 DIAGNOSIS — N949 Unspecified condition associated with female genital organs and menstrual cycle: Secondary | ICD-10-CM

## 2020-01-14 MED ORDER — AMLODIPINE BESYLATE 10 MG PO TABS: 10.0000 mg | ORAL_TABLET | Freq: Every day | ORAL | 3 refills | Status: DC

## 2020-01-14 MED ORDER — CHLORTHALIDONE 50 MG PO TABS: 50.0000 mg | ORAL_TABLET | Freq: Every day | ORAL | 3 refills | Status: DC

## 2020-01-14 MED ORDER — ALBUTEROL SULFATE HFA 108 (90 BASE) MCG/ACT IN AERS: 2.0000 | INHALATION_SPRAY | RESPIRATORY_TRACT | 5 refills | Status: DC | PRN

## 2020-01-14 MED ORDER — ROSUVASTATIN CALCIUM 10 MG PO TABS: 10.0000 mg | ORAL_TABLET | Freq: Every day | ORAL | 3 refills | Status: DC

## 2020-01-14 MED ORDER — LISINOPRIL 40 MG PO TABS: 40.0000 mg | ORAL_TABLET | Freq: Every day | ORAL | 3 refills | Status: DC

## 2020-01-14 NOTE — Patient Instructions (Signed)

## 2020-01-14 NOTE — Progress Notes (Signed)
Jacqueline Thompson 64 y.o.   Chief Complaint  Patient presents with  . Hypertension    6 MONTH   . Medication Refill    PEND - ALL    HISTORY OF PRESENT ILLNESS: This is a 64 y.o. female with history of hypertension here for follow-up and medication refill. #1 hypertension: On amlodipine 10 mg daily, [place] 50 mg daily and lisinopril 40 mg daily. #2 dyslipidemia: On Crestor 10 mg daily. Ran out of amlodipine and lisinopril 2 weeks ago. No complaints or medical concerns today.  HPI   Prior to Admission medications   Medication Sig Start Date End Date Taking? Authorizing Provider  albuterol (VENTOLIN HFA) 108 (90 Base) MCG/ACT inhaler Inhale 2 puffs into the lungs every 4 (four) hours as needed for wheezing or shortness of breath (cough, shortness of breath or wheezing.). 01/14/20  Yes Kurstin Dimarzo, Ines Bloomer, MD  amLODipine (NORVASC) 10 MG tablet Take 1 tablet (10 mg total) by mouth daily. 01/14/20 04/13/20 Yes Wrenly Lauritsen, Ines Bloomer, MD  fluticasone [facility]) 50 MCG/ACT nasal spray Place 2 sprays into both nostrils daily. 02/04/18  Yes Weber, Sarah L, PA-C  rosuvastatin (CRESTOR) 10 MG tablet Take 1 tablet (10 mg total) by mouth daily. 01/14/20  Yes Neveah Bang, Ines Bloomer, MD  chlorthalidone (HYGROTON) 50 MG tablet Take 1 tablet (50 mg total) by mouth daily. 01/14/20 04/13/20  Horald Pollen, MD  lisinopril (ZESTRIL) 40 MG tablet Take 1 tablet (40 mg total) by mouth daily. 01/14/20 04/13/20  Horald Pollen, MD  potassium chloride SA (KLOR-CON M20) 20 MEQ tablet Take 1 tablet (20 mEq total) by mouth 2 (two) times daily. Patient not taking: Reported on 01/14/2020 01/13/19   Rutherford Guys, MD    No Known Allergies  Patient Active Problem List   Diagnosis Date Noted  . Atherosclerosis of aorta ([place]) 01/08/2019  . Elevated LDL cholesterol level 05/20/2018  . Essential hypertension 02/22/2015    Past Medical History:  Diagnosis Date  . Allergy   . Hypertension     Past  Surgical History:  Procedure Laterality Date  . ABDOMINAL HYSTERECTOMY  2007  . Birth Mark Removed from Face Left     Social History   Socioeconomic History  . Marital status: Legally Separated    Spouse name: Not on file  . Number of children: 0  . Years of education: Not on file  . Highest education level: Not on file  Occupational History  . Occupation: distrubution specialist  Tobacco Use  . Smoking status: Former Smoker    Packs/day: 0.75    Years: 10.00    Pack years: 7.50    Start date: 1989    Quit date: 11/29/1997    Years since quitting: 22.1  . Smokeless tobacco: Never Used  . Tobacco comment: smoked a pack per week  Substance and Sexual Activity  . Alcohol use: Yes    Alcohol/week: 4.0 standard drinks    Types: 3 Glasses of wine, 1 Shots of liquor per week    Comment: 2 drinks a week  . Drug use: No  . Sexual activity: Yes    Partners: Male    Birth control/protection: None  Other Topics Concern  . Not on file  Social History Narrative   Marital status: married   Children: none   Lives with: husband           Seatbelt: 100%   Guns in home: no         Exercise -  active at work   Sleep - good   Social Determinants of Radio broadcast assistant Strain:   . Difficulty of Paying Living Expenses:   Food Insecurity:   . Worried About Charity fundraiser in the Last Year:   . Arboriculturist in the Last Year:   Transportation Needs:   . Film/video editor (Medical):   Marland Kitchen Lack of Transportation (Non-Medical):   Physical Activity:   . Days of Exercise per Week:   . Minutes of Exercise per Session:   Stress:   . Feeling of Stress :   Social Connections:   . Frequency of Communication with Friends and Family:   . Frequency of Social Gatherings with Friends and Family:   . Attends Religious Services:   . Active Member of Clubs or Organizations:   . Attends Archivist Meetings:   Marland Kitchen Marital Status:   Intimate Partner Violence:   . Fear  of Current or Ex-Partner:   . Emotionally Abused:   Marland Kitchen Physically Abused:   . Sexually Abused:     Family History  Problem Relation Age of Onset  . Heart disease Mother   . Cancer Father        colon,prostate and spine  . Stroke Brother   . Colon cancer Neg Hx   . Esophageal cancer Neg Hx   . Pancreatic cancer Neg Hx   . Rectal cancer Neg Hx   . Stomach cancer Neg Hx      Review of Systems  Constitutional: Negative.  Negative for chills and fever.  HENT: Negative.  Negative for congestion and sore throat.   Respiratory: Negative.  Negative for cough and hemoptysis.   Cardiovascular: Negative.  Negative for chest pain and palpitations.  Gastrointestinal: Negative.  Negative for abdominal pain, blood in stool, diarrhea, melena, nausea and vomiting.  Genitourinary: Negative.  Negative for dysuria and hematuria.  Skin: Negative.   Neurological: Negative for dizziness and headaches.  All other systems reviewed and are negative.   Vitals:   01/14/20 1059  BP: (!) 171/87  Pulse: 79  Resp: 16  Temp: 97.9 F (36.6 C)  SpO2: 97%    Physical Exam Vitals reviewed.  Constitutional:      Appearance: Normal appearance.  HENT:     Head: Normocephalic.  Eyes:     Extraocular Movements: Extraocular movements intact.     Conjunctiva/sclera: Conjunctivae normal.     Pupils: Pupils are equal, round, and reactive to light.  Cardiovascular:     Rate and Rhythm: Normal rate and regular rhythm.     Pulses: Normal pulses.     Heart sounds: Normal heart sounds.  Pulmonary:     Effort: Pulmonary effort is normal.     Breath sounds: Normal breath sounds.  Musculoskeletal:        General: Normal range of motion.     Cervical back: Normal range of motion and neck supple.  Skin:    General: Skin is warm and dry.     Capillary Refill: Capillary refill takes less than 2 seconds.  Neurological:     General: No focal deficit present.     Mental Status: She is alert and oriented to  person, place, and time.  Psychiatric:        Mood and Affect: Mood normal.        Behavior: Behavior normal.    A total of 30 minutes was spent with the patient, greater than 50% of  which was in counseling/coordination of care regarding hypertension and cardiovascular risks associated with it, review of most recent office visit notes, review of most recent blood results, diet and nutrition, review of all medications, prognosis and need for follow-up.   ASSESSMENT & PLAN: Clinically stable.  No medical concerns identified during this visit.  Continue present medications.  No changes.  Follow-up in 6 months.  Jacqueline Thompson was seen today for hypertension and medication refill.  Diagnoses and all orders for this visit:  Essential hypertension -     Comprehensive metabolic panel -     Hemoglobin A1c -     amLODipine (NORVASC) 10 MG tablet; Take 1 tablet (10 mg total) by mouth daily. -     chlorthalidone (HYGROTON) 50 MG tablet; Take 1 tablet (50 mg total) by mouth daily. -     lisinopril (ZESTRIL) 40 MG tablet; Take 1 tablet (40 mg total) by mouth daily.  Dyslipidemia -     Lipid panel -     rosuvastatin (CRESTOR) 10 MG tablet; Take 1 tablet (10 mg total) by mouth daily.  Osteoporosis screening -     DG Bone Density; Future  Wheezing Comments: Intermittent Orders: -     albuterol (VENTOLIN HFA) 108 (90 Base) MCG/ACT inhaler; Inhale 2 puffs into the lungs every 4 (four) hours as needed for wheezing or shortness of breath (cough, shortness of breath or wheezing.).  Gynecological complaint -     Ambulatory referral to Obstetrics / Gynecology    Patient Instructions  Hypertension, Adult High blood pressure (hypertension) is when the force of blood pumping through the arteries is too strong. The arteries are the blood vessels that carry blood from the heart throughout the body. Hypertension forces the heart to work harder to pump blood and may cause arteries to become narrow or stiff.  Untreated or uncontrolled hypertension can cause a heart attack, heart failure, a stroke, kidney disease, and other problems. A blood pressure reading consists of a higher number over a lower number. Ideally, your blood pressure should be below 120/80. The first ("top") number is called the systolic pressure. It is a measure of the pressure in your arteries as your heart beats. The second ("bottom") number is called the diastolic pressure. It is a measure of the pressure in your arteries as the heart relaxes. What are the causes? The exact cause of this condition is not known. There are some conditions that result in or are related to high blood pressure. What increases the risk? Some risk factors for high blood pressure are under your control. The following factors may make you more likely to develop this condition:  Smoking.  Having type 2 diabetes mellitus, high cholesterol, or both.  Not getting enough exercise or physical activity.  Being overweight.  Having too much fat, sugar, calories, or salt (sodium) in your diet.  Drinking too much alcohol. Some risk factors for high blood pressure may be difficult or impossible to change. Some of these factors include:  Having chronic kidney disease.  Having a family history of high blood pressure.  Age. Risk increases with age.  Race. You may be at higher risk if you are African American.  Gender. Men are at higher risk than women before age 48. After age 43, women are at higher risk than men.  Having obstructive sleep apnea.  Stress. What are the signs or symptoms? High blood pressure may not cause symptoms. Very high blood pressure (hypertensive crisis) may cause:  Headache.  Anxiety.  Shortness of breath.  Nosebleed.  Nausea and vomiting.  Vision changes.  Severe chest pain.  Seizures. How is this diagnosed? This condition is diagnosed by measuring your blood pressure while you are seated, with your arm resting on a  flat surface, your legs uncrossed, and your feet flat on the floor. The cuff of the blood pressure monitor will be placed directly against the skin of your upper arm at the level of your heart. It should be measured at least twice using the same arm. Certain conditions can cause a difference in blood pressure between your right and left arms. Certain factors can cause blood pressure readings to be lower or higher than normal for a short period of time:  When your blood pressure is higher when you are in a health care provider's office than when you are at home, this is called white coat hypertension. Most people with this condition do not need medicines.  When your blood pressure is higher at home than when you are in a health care provider's office, this is called masked hypertension. Most people with this condition may need medicines to control blood pressure. If you have a high blood pressure reading during one visit or you have normal blood pressure with other risk factors, you may be asked to:  Return on a different day to have your blood pressure checked again.  Monitor your blood pressure at home for 1 week or longer. If you are diagnosed with hypertension, you may have other blood or imaging tests to help your health care provider understand your overall risk for other conditions. How is this treated? This condition is treated by making healthy lifestyle changes, such as eating healthy foods, exercising more, and reducing your alcohol intake. Your health care provider may prescribe medicine if lifestyle changes are not enough to get your blood pressure under control, and if:  Your systolic blood pressure is above 130.  Your diastolic blood pressure is above 80. Your personal target blood pressure may vary depending on your medical conditions, your age, and other factors. Follow these instructions at home: Eating and drinking   Eat a diet that is high in fiber and potassium, and low in  sodium, added sugar, and fat. An example eating plan is called the DASH (Dietary Approaches to Stop Hypertension) diet. To eat this way: ? Eat plenty of fresh fruits and vegetables. Try to fill one half of your plate at each meal with fruits and vegetables. ? Eat whole grains, such as whole-wheat pasta, brown rice, or whole-grain bread. Fill about one fourth of your plate with whole grains. ? Eat or drink low-fat dairy products, such as skim milk or low-fat yogurt. ? Avoid fatty cuts of meat, processed or cured meats, and poultry with skin. Fill about one fourth of your plate with lean proteins, such as fish, chicken without skin, beans, eggs, or tofu. ? Avoid pre-made and processed foods. These tend to be higher in sodium, added sugar, and fat.  Reduce your daily sodium intake. Most people with hypertension should eat less than 1,500 mg of sodium a day.  Do not drink alcohol if: ? Your health care provider tells you not to drink. ? You are pregnant, may be pregnant, or are planning to become pregnant.  If you drink alcohol: ? Limit how much you use to:  0-1 drink a day for women.  0-2 drinks a day for men. ? Be aware of how much alcohol is in your  drink. In the [place] one drink equals one 12 oz bottle of beer (355 mL), one 5 oz glass of wine (148 mL), or one 1 oz glass of hard liquor (44 mL). Lifestyle   Work with your health care provider to maintain a healthy body weight or to lose weight. Ask what an ideal weight is for you.  Get at least 30 minutes of exercise most days of the week. Activities may include walking, swimming, or biking.  Include exercise to strengthen your muscles (resistance exercise), such as Pilates or lifting weights, as part of your weekly exercise routine. Try to do these types of exercises for 30 minutes at least 3 days a week.  Do not use any products that contain nicotine or tobacco, such as cigarettes, e-cigarettes, and chewing tobacco. If you need help  quitting, ask your health care provider.  Monitor your blood pressure at home as told by your health care provider.  Keep all follow-up visits as told by your health care provider. This is important. Medicines  Take over-the-counter and prescription medicines only as told by your health care provider. Follow directions carefully. Blood pressure medicines must be taken as prescribed.  Do not skip doses of blood pressure medicine. Doing this puts you at risk for problems and can make the medicine less effective.  Ask your health care provider about side effects or reactions to medicines that you should watch for. Contact a health care provider if you:  Think you are having a reaction to a medicine you are taking.  Have headaches that keep coming back (recurring).  Feel dizzy.  Have swelling in your ankles.  Have trouble with your vision. Get help right away if you:  Develop a severe headache or confusion.  Have unusual weakness or numbness.  Feel faint.  Have severe pain in your chest or abdomen.  Vomit repeatedly.  Have trouble breathing. Summary  Hypertension is when the force of blood pumping through your arteries is too strong. If this condition is not controlled, it may put you at risk for serious complications.  Your personal target blood pressure may vary depending on your medical conditions, your age, and other factors. For most people, a normal blood pressure is less than 120/80.  Hypertension is treated with lifestyle changes, medicines, or a combination of both. Lifestyle changes include losing weight, eating a healthy, low-sodium diet, exercising more, and limiting alcohol. This information is not intended to replace advice given to you by your health care provider. Make sure you discuss any questions you have with your health care provider. Document Revised: 06/25/2018 Document Reviewed: 06/25/2018 Elsevier Patient Education  2020 Elsevier Inc.       Agustina Caroli, MD [facility]

## 2020-01-15 LAB — COMPREHENSIVE METABOLIC PANEL
ALT: 20 IU/L (ref 0–32)
AST: 24 IU/L (ref 0–40)
Albumin/Globulin Ratio: 1.3 (ref 1.2–2.2)
Albumin: 3.9 g/dL (ref 3.8–4.8)
Alkaline Phosphatase: 72 IU/L (ref 39–117)
BUN/Creatinine Ratio: 19 (ref 12–28)
BUN: 16 mg/dL (ref 8–27)
Bilirubin Total: 0.3 mg/dL (ref 0.0–1.2)
CO2: 29 mmol/L (ref 20–29)
Calcium: 9.7 mg/dL (ref 8.7–10.3)
Chloride: 104 mmol/L (ref 96–106)
Creatinine, Ser: 0.85 mg/dL (ref 0.57–1.00)
GFR calc Af Amer: 83 mL/min/{1.73_m2} (ref >59)
GFR calc non Af Amer: 72 mL/min/{1.73_m2} (ref >59)
Globulin, Total: 3 g/dL (ref 1.5–4.5)
Glucose: 94 mg/dL (ref 65–99)
Potassium: 3.1 mmol/L — ABNORMAL LOW (ref 3.5–5.2)
Sodium: 146 mmol/L — ABNORMAL HIGH (ref 134–144)
Total Protein: 6.9 g/dL (ref 6.0–8.5)

## 2020-01-15 LAB — LIPID PANEL
Chol/HDL Ratio: 2.5 ratio (ref 0.0–4.4)
Cholesterol, Total: 149 mg/dL (ref 100–199)
HDL: 59 mg/dL (ref >39)
LDL Chol Calc (NIH): 75 mg/dL (ref 0–99)
Triglycerides: 76 mg/dL (ref 0–149)
VLDL Cholesterol Cal: 15 mg/dL (ref 5–40)

## 2020-01-15 LAB — HEMOGLOBIN A1C
Est. average glucose Bld gHb Est-mCnc: 111 mg/dL
Hgb A1c MFr Bld: 5.5 % (ref 4.8–5.6)

## 2020-01-16 ENCOUNTER — Other Ambulatory Visit: Payer: Self-pay | Admitting: Emergency Medicine

## 2020-01-16 DIAGNOSIS — E876 Hypokalemia: Secondary | ICD-10-CM

## 2020-01-16 MED ORDER — POTASSIUM CHLORIDE CRYS ER 20 MEQ PO TBCR: 20.0000 meq | EXTENDED_RELEASE_TABLET | Freq: Two times a day (BID) | ORAL | 0 refills | Status: DC

## 2020-01-18 NOTE — Progress Notes (Signed)
Spoke with pt regarding labs, no follow up questions at this time

## 2020-02-01 ENCOUNTER — Other Ambulatory Visit: Payer: Self-pay | Admitting: Emergency Medicine

## 2020-02-01 DIAGNOSIS — Z13820 Encounter for screening for osteoporosis: Secondary | ICD-10-CM

## 2020-03-02 ENCOUNTER — Encounter: Payer: BC Managed Care – PPO | Admitting: Family Medicine

## 2020-03-03 ENCOUNTER — Other Ambulatory Visit: Payer: Self-pay | Admitting: Emergency Medicine

## 2020-03-03 DIAGNOSIS — Z1239 Encounter for other screening for malignant neoplasm of breast: Secondary | ICD-10-CM

## 2020-03-19 ENCOUNTER — Other Ambulatory Visit: Payer: Self-pay | Admitting: Emergency Medicine

## 2020-03-19 DIAGNOSIS — E876 Hypokalemia: Secondary | ICD-10-CM

## 2020-03-19 NOTE — Telephone Encounter (Signed)
Requested Prescriptions  Pending Prescriptions Disp Refills  . KLOR-CON M20 20 MEQ tablet [Pharmacy Med Name: KLOR-CON M20 TABLET] 180 tablet 1    Sig: TAKE 1 TABLET BY MOUTH TWICE A DAY     Endocrinology:  Minerals - Potassium Supplementation Failed - 03/19/2020  9:17 AM      Failed - K in normal range and within 360 days    Potassium  Date Value Ref Range Status  01/14/2020 3.1 (L) 3.5 - 5.2 mmol/L Final         Passed - Cr in normal range and within 360 days    Creat  Date Value Ref Range Status  12/13/2015 0.74 0.50 - 0.99 mg/dL Final   Creatinine, Ser  Date Value Ref Range Status  01/14/2020 0.85 0.57 - 1.00 mg/dL Final         Passed - Valid encounter within last 12 months    Recent Outpatient Visits          2 months ago Essential hypertension   [facility] [place], Ines Bloomer, MD   8 months ago Essential hypertension   [facility], Ines Bloomer, MD   1 year ago Essential hypertension   [facility] [place], Ines Bloomer, MD   1 year ago Essential hypertension   [facility], Damaris Hippo, PA-C   2 years ago Essential hypertension   [facility], Damaris Hippo, PA-C      Future Appointments            In 3 months Sagardia, Ines Bloomer, MD [facility] [place]

## 2020-03-30 ENCOUNTER — Other Ambulatory Visit: Payer: BC Managed Care – PPO

## 2020-03-30 ENCOUNTER — Ambulatory Visit: Payer: BC Managed Care – PPO

## 2020-03-31 ENCOUNTER — Encounter: Payer: BC Managed Care – PPO | Admitting: Obstetrics and Gynecology

## 2020-03-31 ENCOUNTER — Encounter: Payer: Self-pay | Admitting: Family Medicine

## 2020-05-20 ENCOUNTER — Ambulatory Visit
Admission: RE | Admit: 2020-05-20 | Discharge: 2020-05-20 | Disposition: A | Discharge status: Home / Self Care | Payer: BC Managed Care – PPO | Source: Ambulatory Visit | Attending: Emergency Medicine | Admitting: Emergency Medicine

## 2020-05-20 ENCOUNTER — Other Ambulatory Visit: Payer: Self-pay

## 2020-05-20 ENCOUNTER — Other Ambulatory Visit: Payer: BC Managed Care – PPO

## 2020-05-20 ENCOUNTER — Ambulatory Visit: Payer: BC Managed Care – PPO

## 2020-05-20 DIAGNOSIS — Z1231 Encounter for screening mammogram for malignant neoplasm of breast: Secondary | ICD-10-CM | POA: Diagnosis not present

## 2020-05-20 DIAGNOSIS — Z1239 Encounter for other screening for malignant neoplasm of breast: Secondary | ICD-10-CM

## 2020-05-25 ENCOUNTER — Other Ambulatory Visit: Payer: Self-pay | Admitting: Emergency Medicine

## 2020-05-25 DIAGNOSIS — R928 Other abnormal and inconclusive findings on diagnostic imaging of breast: Secondary | ICD-10-CM

## 2020-05-30 ENCOUNTER — Other Ambulatory Visit: Payer: Self-pay

## 2020-05-30 ENCOUNTER — Ambulatory Visit
Admission: RE | Admit: 2020-05-30 | Discharge: 2020-05-30 | Disposition: A | Discharge status: Home / Self Care | Payer: BC Managed Care – PPO | Source: Ambulatory Visit | Attending: Emergency Medicine | Admitting: Emergency Medicine

## 2020-05-30 ENCOUNTER — Telehealth: Payer: Self-pay | Admitting: Emergency Medicine

## 2020-05-30 DIAGNOSIS — N6489 Other specified disorders of breast: Secondary | ICD-10-CM | POA: Diagnosis not present

## 2020-05-30 DIAGNOSIS — R928 Other abnormal and inconclusive findings on diagnostic imaging of breast: Secondary | ICD-10-CM | POA: Diagnosis not present

## 2020-05-30 NOTE — Telephone Encounter (Signed)
Patient is returning a call from our office  /  Unable to find documtation

## 2020-05-31 ENCOUNTER — Encounter: Payer: Self-pay | Admitting: Radiology

## 2020-07-14 ENCOUNTER — Telehealth: Payer: Self-pay | Admitting: *Deleted

## 2020-07-14 ENCOUNTER — Encounter: Payer: Self-pay | Admitting: Emergency Medicine

## 2020-07-14 ENCOUNTER — Ambulatory Visit: Payer: BC Managed Care – PPO | Admitting: Emergency Medicine

## 2020-07-14 ENCOUNTER — Other Ambulatory Visit: Payer: Self-pay

## 2020-07-14 ENCOUNTER — Ambulatory Visit
Admission: RE | Admit: 2020-07-14 | Discharge: 2020-07-14 | Disposition: A | Discharge status: Home / Self Care | Payer: BC Managed Care – PPO | Source: Ambulatory Visit | Attending: Emergency Medicine | Admitting: Emergency Medicine

## 2020-07-14 VITALS — BP 136/78 | HR 79 | Temp 98.0°F | Resp 16 | Ht 65.0 in | Wt 148.0 lb

## 2020-07-14 DIAGNOSIS — E785 Hyperlipidemia, unspecified: Secondary | ICD-10-CM

## 2020-07-14 DIAGNOSIS — J302 Other seasonal allergic rhinitis: Secondary | ICD-10-CM

## 2020-07-14 DIAGNOSIS — I10 Essential (primary) hypertension: Secondary | ICD-10-CM | POA: Diagnosis not present

## 2020-07-14 DIAGNOSIS — E876 Hypokalemia: Secondary | ICD-10-CM

## 2020-07-14 DIAGNOSIS — Z13820 Encounter for screening for osteoporosis: Secondary | ICD-10-CM

## 2020-07-14 MED ORDER — POTASSIUM CHLORIDE CRYS ER 20 MEQ PO TBCR: 20.0000 meq | EXTENDED_RELEASE_TABLET | Freq: Two times a day (BID) | ORAL | 3 refills | Status: DC

## 2020-07-14 MED ORDER — LOSARTAN POTASSIUM-HCTZ 100-12.5 MG PO TABS: 1.0000 | ORAL_TABLET | Freq: Every day | ORAL | 3 refills | Status: DC

## 2020-07-14 MED ORDER — FLUTICASONE PROPIONATE 50 MCG/ACT NA SUSP: 2.0000 | Freq: Every day | NASAL | 1 refills | Status: DC

## 2020-07-14 MED ORDER — CHLORTHALIDONE 50 MG PO TABS: 50.0000 mg | ORAL_TABLET | Freq: Every day | ORAL | 3 refills | Status: DC

## 2020-07-14 MED ORDER — AMLODIPINE BESYLATE 10 MG PO TABS: 10.0000 mg | ORAL_TABLET | Freq: Every day | ORAL | 3 refills | Status: DC

## 2020-07-14 MED ORDER — LISINOPRIL 40 MG PO TABS: 40.0000 mg | ORAL_TABLET | Freq: Every day | ORAL | 3 refills | Status: DC

## 2020-07-14 MED ORDER — ROSUVASTATIN CALCIUM 10 MG PO TABS: 10.0000 mg | ORAL_TABLET | Freq: Every day | ORAL | 3 refills | Status: DC

## 2020-07-14 NOTE — Assessment & Plan Note (Signed)
Persistently elevated systolic blood pressure in the office and at home. Also has history of hypokalemia most likely related to high dose of chlorthalidone. We will stop chlorthalidone and lisinopril and start losartan-hydrochlorothiazide 100-12.5 mg daily.  May need to stop potassium supplement depending on blood results. Follow-up in 4 weeks.

## 2020-07-14 NOTE — Telephone Encounter (Signed)
Called [facility] on [address] spoke to [employer] (pharmacist) to make sure Chlorthalidone and Lisinopril was not refilled. Dr Mitchel Honour changed to new prescription Losartan/Hctz 100-12.5 mg. The pharmacist stated she changed to the new one.

## 2020-07-14 NOTE — Progress Notes (Signed)
Jacqueline Thompson 64 y.o.   Chief Complaint  Patient presents with  . Hypertension    follow up 6 month and check cholesterol  . Medication Refill    HISTORY OF PRESENT ILLNESS: This is a 64 y.o. female with history of hypertension here for follow-up. Presently taking amlodipine, chlorthalidone, and lisinopril. Also has a history of dyslipidemia on rosuvastatin 10 mg daily. Has history of chronic hypokalemia, on potassium supplementation. Today complaining of left lumbar pain for 1 week.  Slowly getting better.  No associated symptoms.  Denies any injuries.  No urinary symptoms. Fully vaccinated against Covid. No other complaints or medical concerns. Recent mammogram within normal limits. Colonoscopy in 2018 showed polyps and diverticulosis.  Otherwise unremarkable. Results reviewed with patient. BP Readings from Last 3 Encounters:  07/14/20 (!) 155/81  01/14/20 (!) 156/82  07/07/19 (!) 152/86    HPI   Prior to Admission medications   Medication Sig Start Date End Date Taking? Authorizing Provider  albuterol (VENTOLIN HFA) 108 (90 Base) MCG/ACT inhaler Inhale 2 puffs into the lungs every 4 (four) hours as needed for wheezing or shortness of breath (cough, shortness of breath or wheezing.). 01/14/20  Yes Zephyr Sausedo, Ines Bloomer, MD  fluticasone [facility]) 50 MCG/ACT nasal spray Place 2 sprays into both nostrils daily. 02/04/18  Yes Weber, Sarah L, PA-C  KLOR-CON M20 20 MEQ tablet TAKE 1 TABLET BY MOUTH TWICE A DAY 03/19/20  Yes Aberdeen Hafen, Ines Bloomer, MD  rosuvastatin (CRESTOR) 10 MG tablet Take 1 tablet (10 mg total) by mouth daily. 01/14/20  Yes Dirk Vanaman, Ines Bloomer, MD  amLODipine (NORVASC) 10 MG tablet Take 1 tablet (10 mg total) by mouth daily. 01/14/20 04/13/20  Horald Pollen, MD  chlorthalidone (HYGROTON) 50 MG tablet Take 1 tablet (50 mg total) by mouth daily. 01/14/20 04/13/20  Horald Pollen, MD  lisinopril (ZESTRIL) 40 MG tablet Take 1 tablet (40 mg total)  by mouth daily. 01/14/20 04/13/20  Horald Pollen, MD    No Known Allergies  Patient Active Problem List   Diagnosis Date Noted  . Atherosclerosis of aorta ([place]) 01/08/2019  . Elevated LDL cholesterol level 05/20/2018  . Essential hypertension 02/22/2015    Past Medical History:  Diagnosis Date  . Allergy   . Hypertension     Past Surgical History:  Procedure Laterality Date  . ABDOMINAL HYSTERECTOMY  2007  . Birth Mark Removed from Face Left     Social History   Socioeconomic History  . Marital status: Legally Separated    Spouse name: Not on file  . Number of children: 0  . Years of education: Not on file  . Highest education level: Not on file  Occupational History  . Occupation: distrubution specialist  Tobacco Use  . Smoking status: Former Smoker    Packs/day: 0.75    Years: 10.00    Pack years: 7.50    Start date: 1989    Quit date: 11/29/1997    Years since quitting: 22.6  . Smokeless tobacco: Never Used  . Tobacco comment: smoked a pack per week  Vaping Use  . Vaping Use: Never used  Substance and Sexual Activity  . Alcohol use: Yes    Alcohol/week: 4.0 standard drinks    Types: 3 Glasses of wine, 1 Shots of liquor per week    Comment: 2 drinks a week  . Drug use: No  . Sexual activity: Yes    Partners: Male    Birth control/protection: None  Other Topics  Concern  . Not on file  Social History Narrative   Marital status: married   Children: none   Lives with: husband           Seatbelt: 100%   Guns in home: no         Exercise - active at work   Sleep - good   Social Determinants of Health   Financial Resource Strain:   . Difficulty of Paying Living Expenses: Not on file  Food Insecurity:   . Worried About Charity fundraiser in the Last Year: Not on file  . Ran Out of Food in the Last Year: Not on file  Transportation Needs:   . Lack of Transportation (Medical): Not on file  . Lack of Transportation (Non-Medical): Not on file   Physical Activity:   . Days of Exercise per Week: Not on file  . Minutes of Exercise per Session: Not on file  Stress:   . Feeling of Stress : Not on file  Social Connections:   . Frequency of Communication with Friends and Family: Not on file  . Frequency of Social Gatherings with Friends and Family: Not on file  . Attends Religious Services: Not on file  . Active Member of Clubs or Organizations: Not on file  . Attends Archivist Meetings: Not on file  . Marital Status: Not on file  Intimate Partner Violence:   . Fear of Current or Ex-Partner: Not on file  . Emotionally Abused: Not on file  . Physically Abused: Not on file  . Sexually Abused: Not on file    Family History  Problem Relation Age of Onset  . Heart disease Mother   . Cancer Father        colon,prostate and spine  . Stroke Brother   . Colon cancer Neg Hx   . Esophageal cancer Neg Hx   . Pancreatic cancer Neg Hx   . Rectal cancer Neg Hx   . Stomach cancer Neg Hx      Review of Systems  Constitutional: Negative.  Negative for chills and fever.  HENT: Negative.  Negative for congestion and sore throat.   Respiratory: Negative.  Negative for cough and shortness of breath.   Cardiovascular: Negative.  Negative for chest pain and palpitations.  Gastrointestinal: Negative.  Negative for abdominal pain, diarrhea, nausea and vomiting.  Genitourinary: Negative.  Negative for dysuria and hematuria.  Skin: Negative.  Negative for rash.  Neurological: Negative for dizziness and headaches.  All other systems reviewed and are negative.   Today's Vitals   07/14/20 0846  BP: (!) 155/81  Pulse: 79  Resp: 16  Temp: 98 F (36.7 C)  TempSrc: Temporal  SpO2: 96%  Weight: 148 lb (67.1 kg)  Height: 5\' 5"  (1.651 m)   Body mass index is 24.63 kg/m. Wt Readings from Last 3 Encounters:  07/14/20 148 lb (67.1 kg)  01/14/20 151 lb (68.5 kg)  07/07/19 175 lb (79.4 kg)    Physical Exam Vitals reviewed.   Constitutional:      Appearance: Normal appearance.  HENT:     Head: Normocephalic and atraumatic.  Eyes:     Extraocular Movements: Extraocular movements intact.     Conjunctiva/sclera: Conjunctivae normal.     Pupils: Pupils are equal, round, and reactive to light.  Cardiovascular:     Rate and Rhythm: Normal rate and regular rhythm.     Pulses: Normal pulses.     Heart sounds: Normal heart sounds.  Pulmonary:     Effort: Pulmonary effort is normal.     Breath sounds: Normal breath sounds.  Musculoskeletal:        General: Normal range of motion.     Cervical back: Normal range of motion and neck supple.  Skin:    General: Skin is warm and dry.     Capillary Refill: Capillary refill takes less than 2 seconds.  Neurological:     General: No focal deficit present.     Mental Status: She is alert and oriented to person, place, and time.  Psychiatric:        Mood and Affect: Mood normal.        Behavior: Behavior normal.    A total of 30 minutes was spent with the patient, greater than 50% of which was in counseling/coordination of care regarding hypertension and dyslipidemia and cardiovascular risks associated with these conditions, review of all medications and changes, review of hypokalemia and possible causes, review of most recent office visit notes, review of most recent blood work results, diet and nutrition, prognosis and need for follow-up in 4 weeks.   ASSESSMENT & PLAN: Essential hypertension Persistently elevated systolic blood pressure in the office and at home. Also has history of hypokalemia most likely related to high dose of chlorthalidone. We will stop chlorthalidone and lisinopril and start losartan-hydrochlorothiazide 100-12.5 mg daily.  May need to stop potassium supplement depending on blood results. Follow-up in 4 weeks.  Jahmia was seen today for hypertension and medication refill.  Diagnoses and all orders for this visit:  Essential hypertension -      Comprehensive metabolic panel -     amLODipine (NORVASC) 10 MG tablet; Take 1 tablet (10 mg total) by mouth daily. -     Discontinue: chlorthalidone (HYGROTON) 50 MG tablet; Take 1 tablet (50 mg total) by mouth daily. -     Discontinue: lisinopril (ZESTRIL) 40 MG tablet; Take 1 tablet (40 mg total) by mouth daily. -     losartan-hydrochlorothiazide (HYZAAR) 100-12.5 MG tablet; Take 1 tablet by mouth daily.  Seasonal allergies -     fluticasone (FLONASE) 50 MCG/ACT nasal spray; Place 2 sprays into both nostrils daily.  Hypokalemia -     Comprehensive metabolic panel -     potassium chloride SA (KLOR-CON M20) 20 MEQ tablet; Take 1 tablet (20 mEq total) by mouth 2 (two) times daily.  Dyslipidemia -     rosuvastatin (CRESTOR) 10 MG tablet; Take 1 tablet (10 mg total) by mouth daily.    Patient Instructions   Stop chlorthalidone and stop lisinopril. Start losartan-hydrochlorothiazide 100-12.5 mg daily. Follow-up in 4 weeks.    If you have lab work done today you will be contacted with your lab results within the next 2 weeks.  If you have not heard from [place] then please contact us. The fastest way to get your results is to register for My Chart.   IF you received an x-ray today, you will receive an invoice from [facility]. Please contact [facility] at [phone] with questions or concerns regarding your invoice.   IF you received labwork today, you will receive an invoice from [facility]. Please contact [facility] at [phone] with questions or concerns regarding your invoice.   Our billing staff will not be able to assist you with questions regarding bills from these companies.  You will be contacted with the lab results as soon as they are available. The fastest way to get your results is to activate  your My Chart account. Instructions are located on the last page of this paperwork. If you have not heard from [place] regarding the results in 2 weeks, please contact  this office.     Hypertension, Adult High blood pressure (hypertension) is when the force of blood pumping through the arteries is too strong. The arteries are the blood vessels that carry blood from the heart throughout the body. Hypertension forces the heart to work harder to pump blood and may cause arteries to become narrow or stiff. Untreated or uncontrolled hypertension can cause a heart attack, heart failure, a stroke, kidney disease, and other problems. A blood pressure reading consists of a higher number over a lower number. Ideally, your blood pressure should be below 120/80. The first ("top") number is called the systolic pressure. It is a measure of the pressure in your arteries as your heart beats. The second ("bottom") number is called the diastolic pressure. It is a measure of the pressure in your arteries as the heart relaxes. What are the causes? The exact cause of this condition is not known. There are some conditions that result in or are related to high blood pressure. What increases the risk? Some risk factors for high blood pressure are under your control. The following factors may make you more likely to develop this condition:  Smoking.  Having type 2 diabetes mellitus, high cholesterol, or both.  Not getting enough exercise or physical activity.  Being overweight.  Having too much fat, sugar, calories, or salt (sodium) in your diet.  Drinking too much alcohol. Some risk factors for high blood pressure may be difficult or impossible to change. Some of these factors include:  Having chronic kidney disease.  Having a family history of high blood pressure.  Age. Risk increases with age.  Race. You may be at higher risk if you are African American.  Gender. Men are at higher risk than women before age 14. After age 93, women are at higher risk than men.  Having obstructive sleep apnea.  Stress. What are the signs or symptoms? High blood pressure may not  cause symptoms. Very high blood pressure (hypertensive crisis) may cause:  Headache.  Anxiety.  Shortness of breath.  Nosebleed.  Nausea and vomiting.  Vision changes.  Severe chest pain.  Seizures. How is this diagnosed? This condition is diagnosed by measuring your blood pressure while you are seated, with your arm resting on a flat surface, your legs uncrossed, and your feet flat on the floor. The cuff of the blood pressure monitor will be placed directly against the skin of your upper arm at the level of your heart. It should be measured at least twice using the same arm. Certain conditions can cause a difference in blood pressure between your right and left arms. Certain factors can cause blood pressure readings to be lower or higher than normal for a short period of time:  When your blood pressure is higher when you are in a health care provider's office than when you are at home, this is called white coat hypertension. Most people with this condition do not need medicines.  When your blood pressure is higher at home than when you are in a health care provider's office, this is called masked hypertension. Most people with this condition may need medicines to control blood pressure. If you have a high blood pressure reading during one visit or you have normal blood pressure with other risk factors, you may be asked to:  Return on a different day to have your blood pressure checked again.  Monitor your blood pressure at home for 1 week or longer. If you are diagnosed with hypertension, you may have other blood or imaging tests to help your health care provider understand your overall risk for other conditions. How is this treated? This condition is treated by making healthy lifestyle changes, such as eating healthy foods, exercising more, and reducing your alcohol intake. Your health care provider may prescribe medicine if lifestyle changes are not enough to get your blood pressure  under control, and if:  Your systolic blood pressure is above 130.  Your diastolic blood pressure is above 80. Your personal target blood pressure may vary depending on your medical conditions, your age, and other factors. Follow these instructions at home: Eating and drinking   Eat a diet that is high in fiber and potassium, and low in sodium, added sugar, and fat. An example eating plan is called the DASH (Dietary Approaches to Stop Hypertension) diet. To eat this way: ? Eat plenty of fresh fruits and vegetables. Try to fill one half of your plate at each meal with fruits and vegetables. ? Eat whole grains, such as whole-wheat pasta, brown rice, or whole-grain bread. Fill about one fourth of your plate with whole grains. ? Eat or drink low-fat dairy products, such as skim milk or low-fat yogurt. ? Avoid fatty cuts of meat, processed or cured meats, and poultry with skin. Fill about one fourth of your plate with lean proteins, such as fish, chicken without skin, beans, eggs, or tofu. ? Avoid pre-made and processed foods. These tend to be higher in sodium, added sugar, and fat.  Reduce your daily sodium intake. Most people with hypertension should eat less than 1,500 mg of sodium a day.  Do not drink alcohol if: ? Your health care provider tells you not to drink. ? You are pregnant, may be pregnant, or are planning to become pregnant.  If you drink alcohol: ? Limit how much you use to:  0-1 drink a day for women.  0-2 drinks a day for men. ? Be aware of how much alcohol is in your drink. In the [place] one drink equals one 12 oz bottle of beer (355 mL), one 5 oz glass of wine (148 mL), or one 1 oz glass of hard liquor (44 mL). Lifestyle   Work with your health care provider to maintain a healthy body weight or to lose weight. Ask what an ideal weight is for you.  Get at least 30 minutes of exercise most days of the week. Activities may include walking, swimming, or  biking.  Include exercise to strengthen your muscles (resistance exercise), such as Pilates or lifting weights, as part of your weekly exercise routine. Try to do these types of exercises for 30 minutes at least 3 days a week.  Do not use any products that contain nicotine or tobacco, such as cigarettes, e-cigarettes, and chewing tobacco. If you need help quitting, ask your health care provider.  Monitor your blood pressure at home as told by your health care provider.  Keep all follow-up visits as told by your health care provider. This is important. Medicines  Take over-the-counter and prescription medicines only as told by your health care provider. Follow directions carefully. Blood pressure medicines must be taken as prescribed.  Do not skip doses of blood pressure medicine. Doing this puts you at risk for problems and can make the medicine less effective.  Ask your health care provider about side effects or reactions to medicines that you should watch for. Contact a health care provider if you:  Think you are having a reaction to a medicine you are taking.  Have headaches that keep coming back (recurring).  Feel dizzy.  Have swelling in your ankles.  Have trouble with your vision. Get help right away if you:  Develop a severe headache or confusion.  Have unusual weakness or numbness.  Feel faint.  Have severe pain in your chest or abdomen.  Vomit repeatedly.  Have trouble breathing. Summary  Hypertension is when the force of blood pumping through your arteries is too strong. If this condition is not controlled, it may put you at risk for serious complications.  Your personal target blood pressure may vary depending on your medical conditions, your age, and other factors. For most people, a normal blood pressure is less than 120/80.  Hypertension is treated with lifestyle changes, medicines, or a combination of both. Lifestyle changes include losing weight, eating a  healthy, low-sodium diet, exercising more, and limiting alcohol. This information is not intended to replace advice given to you by your health care provider. Make sure you discuss any questions you have with your health care provider. Document Revised: 06/25/2018 Document Reviewed: 06/25/2018 Elsevier Patient Education  2020 [employer]      Agustina Caroli, MD [facility]

## 2020-07-14 NOTE — Patient Instructions (Addendum)
Stop chlorthalidone and stop lisinopril. Start losartan-hydrochlorothiazide 100-12.5 mg daily. Follow-up in 4 weeks.    If you have lab work done today you will be contacted with your lab results within the next 2 weeks.  If you have not heard from [place] then please contact us. The fastest way to get your results is to register for My Chart.   IF you received an x-ray today, you will receive an invoice from [facility]. Please contact [facility] at [phone] with questions or concerns regarding your invoice.   IF you received labwork today, you will receive an invoice from [place]. Please contact [facility] at [phone] with questions or concerns regarding your invoice.   Our billing staff will not be able to assist you with questions regarding bills from these companies.  You will be contacted with the lab results as soon as they are available. The fastest way to get your results is to activate your My Chart account. Instructions are located on the last page of this paperwork. If you have not heard from [place] regarding the results in 2 weeks, please contact this office.     Hypertension, Adult High blood pressure (hypertension) is when the force of blood pumping through the arteries is too strong. The arteries are the blood vessels that carry blood from the heart throughout the body. Hypertension forces the heart to work harder to pump blood and may cause arteries to become narrow or stiff. Untreated or uncontrolled hypertension can cause a heart attack, heart failure, a stroke, kidney disease, and other problems. A blood pressure reading consists of a higher number over a lower number. Ideally, your blood pressure should be below 120/80. The first ("top") number is called the systolic pressure. It is a measure of the pressure in your arteries as your heart beats. The second ("bottom") number is called the diastolic pressure. It is a measure of the pressure in your arteries as  the heart relaxes. What are the causes? The exact cause of this condition is not known. There are some conditions that result in or are related to high blood pressure. What increases the risk? Some risk factors for high blood pressure are under your control. The following factors may make you more likely to develop this condition:  Smoking.  Having type 2 diabetes mellitus, high cholesterol, or both.  Not getting enough exercise or physical activity.  Being overweight.  Having too much fat, sugar, calories, or salt (sodium) in your diet.  Drinking too much alcohol. Some risk factors for high blood pressure may be difficult or impossible to change. Some of these factors include:  Having chronic kidney disease.  Having a family history of high blood pressure.  Age. Risk increases with age.  Race. You may be at higher risk if you are African American.  Gender. Men are at higher risk than women before age 22. After age 28, women are at higher risk than men.  Having obstructive sleep apnea.  Stress. What are the signs or symptoms? High blood pressure may not cause symptoms. Very high blood pressure (hypertensive crisis) may cause:  Headache.  Anxiety.  Shortness of breath.  Nosebleed.  Nausea and vomiting.  Vision changes.  Severe chest pain.  Seizures. How is this diagnosed? This condition is diagnosed by measuring your blood pressure while you are seated, with your arm resting on a flat surface, your legs uncrossed, and your feet flat on the floor. The cuff of the blood pressure monitor will be placed  directly against the skin of your upper arm at the level of your heart. It should be measured at least twice using the same arm. Certain conditions can cause a difference in blood pressure between your right and left arms. Certain factors can cause blood pressure readings to be lower or higher than normal for a short period of time:  When your blood pressure is higher  when you are in a health care provider's office than when you are at home, this is called white coat hypertension. Most people with this condition do not need medicines.  When your blood pressure is higher at home than when you are in a health care provider's office, this is called masked hypertension. Most people with this condition may need medicines to control blood pressure. If you have a high blood pressure reading during one visit or you have normal blood pressure with other risk factors, you may be asked to:  Return on a different day to have your blood pressure checked again.  Monitor your blood pressure at home for 1 week or longer. If you are diagnosed with hypertension, you may have other blood or imaging tests to help your health care provider understand your overall risk for other conditions. How is this treated? This condition is treated by making healthy lifestyle changes, such as eating healthy foods, exercising more, and reducing your alcohol intake. Your health care provider may prescribe medicine if lifestyle changes are not enough to get your blood pressure under control, and if:  Your systolic blood pressure is above 130.  Your diastolic blood pressure is above 80. Your personal target blood pressure may vary depending on your medical conditions, your age, and other factors. Follow these instructions at home: Eating and drinking   Eat a diet that is high in fiber and potassium, and low in sodium, added sugar, and fat. An example eating plan is called the DASH (Dietary Approaches to Stop Hypertension) diet. To eat this way: ? Eat plenty of fresh fruits and vegetables. Try to fill one half of your plate at each meal with fruits and vegetables. ? Eat whole grains, such as whole-wheat pasta, brown rice, or whole-grain bread. Fill about one fourth of your plate with whole grains. ? Eat or drink low-fat dairy products, such as skim milk or low-fat yogurt. ? Avoid fatty cuts of  meat, processed or cured meats, and poultry with skin. Fill about one fourth of your plate with lean proteins, such as fish, chicken without skin, beans, eggs, or tofu. ? Avoid pre-made and processed foods. These tend to be higher in sodium, added sugar, and fat.  Reduce your daily sodium intake. Most people with hypertension should eat less than 1,500 mg of sodium a day.  Do not drink alcohol if: ? Your health care provider tells you not to drink. ? You are pregnant, may be pregnant, or are planning to become pregnant.  If you drink alcohol: ? Limit how much you use to:  0-1 drink a day for women.  0-2 drinks a day for men. ? Be aware of how much alcohol is in your drink. In the [place] one drink equals one 12 oz bottle of beer (355 mL), one 5 oz glass of wine (148 mL), or one 1 oz glass of hard liquor (44 mL). Lifestyle   Work with your health care provider to maintain a healthy body weight or to lose weight. Ask what an ideal weight is for you.  Get at least 30  minutes of exercise most days of the week. Activities may include walking, swimming, or biking.  Include exercise to strengthen your muscles (resistance exercise), such as Pilates or lifting weights, as part of your weekly exercise routine. Try to do these types of exercises for 30 minutes at least 3 days a week.  Do not use any products that contain nicotine or tobacco, such as cigarettes, e-cigarettes, and chewing tobacco. If you need help quitting, ask your health care provider.  Monitor your blood pressure at home as told by your health care provider.  Keep all follow-up visits as told by your health care provider. This is important. Medicines  Take over-the-counter and prescription medicines only as told by your health care provider. Follow directions carefully. Blood pressure medicines must be taken as prescribed.  Do not skip doses of blood pressure medicine. Doing this puts you at risk for problems and can make the  medicine less effective.  Ask your health care provider about side effects or reactions to medicines that you should watch for. Contact a health care provider if you:  Think you are having a reaction to a medicine you are taking.  Have headaches that keep coming back (recurring).  Feel dizzy.  Have swelling in your ankles.  Have trouble with your vision. Get help right away if you:  Develop a severe headache or confusion.  Have unusual weakness or numbness.  Feel faint.  Have severe pain in your chest or abdomen.  Vomit repeatedly.  Have trouble breathing. Summary  Hypertension is when the force of blood pumping through your arteries is too strong. If this condition is not controlled, it may put you at risk for serious complications.  Your personal target blood pressure may vary depending on your medical conditions, your age, and other factors. For most people, a normal blood pressure is less than 120/80.  Hypertension is treated with lifestyle changes, medicines, or a combination of both. Lifestyle changes include losing weight, eating a healthy, low-sodium diet, exercising more, and limiting alcohol. This information is not intended to replace advice given to you by your health care provider. Make sure you discuss any questions you have with your health care provider. Document Revised: 06/25/2018 Document Reviewed: 06/25/2018 Elsevier Patient Education  2020 [employer].

## 2020-07-19 LAB — ALDOSTERONE + RENIN ACTIVITY W/ RATIO
ALDOS/RENIN RATIO: 5.7 (ref 0.0–30.0)
ALDOSTERONE: 11.6 ng/dL (ref 0.0–30.0)
Renin: 2.052 ng/mL/hr (ref 0.167–5.380)

## 2020-07-19 LAB — COMPREHENSIVE METABOLIC PANEL
ALT: 24 IU/L (ref 0–32)
AST: 29 IU/L (ref 0–40)
Albumin/Globulin Ratio: 1.6 (ref 1.2–2.2)
Albumin: 4.5 g/dL (ref 3.8–4.8)
Alkaline Phosphatase: 71 IU/L (ref 44–121)
BUN/Creatinine Ratio: 25 (ref 12–28)
BUN: 21 mg/dL (ref 8–27)
Bilirubin Total: 0.5 mg/dL (ref 0.0–1.2)
CO2: 26 mmol/L (ref 20–29)
Calcium: 9.5 mg/dL (ref 8.7–10.3)
Chloride: 103 mmol/L (ref 96–106)
Creatinine, Ser: 0.84 mg/dL (ref 0.57–1.00)
GFR calc Af Amer: 84 mL/min/{1.73_m2} (ref >59)
GFR calc non Af Amer: 73 mL/min/{1.73_m2} (ref >59)
Globulin, Total: 2.9 g/dL (ref 1.5–4.5)
Glucose: 96 mg/dL (ref 65–99)
Potassium: 3.4 mmol/L — ABNORMAL LOW (ref 3.5–5.2)
Sodium: 144 mmol/L (ref 134–144)
Total Protein: 7.4 g/dL (ref 6.0–8.5)

## 2020-07-22 ENCOUNTER — Other Ambulatory Visit: Payer: Self-pay | Admitting: Emergency Medicine

## 2020-07-22 DIAGNOSIS — I10 Essential (primary) hypertension: Secondary | ICD-10-CM

## 2020-08-17 ENCOUNTER — Ambulatory Visit: Payer: BC Managed Care – PPO | Admitting: Emergency Medicine

## 2020-08-17 ENCOUNTER — Other Ambulatory Visit: Payer: Self-pay

## 2020-08-17 ENCOUNTER — Encounter: Payer: Self-pay | Admitting: Emergency Medicine

## 2020-08-17 VITALS — BP 142/80 | HR 80 | Temp 98.3°F | Resp 16 | Ht 65.0 in | Wt 149.0 lb

## 2020-08-17 DIAGNOSIS — I10 Essential (primary) hypertension: Secondary | ICD-10-CM

## 2020-08-17 DIAGNOSIS — I700 Atherosclerosis of aorta: Secondary | ICD-10-CM | POA: Diagnosis not present

## 2020-08-17 DIAGNOSIS — E785 Hyperlipidemia, unspecified: Secondary | ICD-10-CM | POA: Diagnosis not present

## 2020-08-17 DIAGNOSIS — R7303 Prediabetes: Secondary | ICD-10-CM

## 2020-08-17 DIAGNOSIS — E876 Hypokalemia: Secondary | ICD-10-CM | POA: Diagnosis not present

## 2020-08-17 NOTE — Patient Instructions (Addendum)
   If you have lab work done today you will be contacted with your lab results within the next 2 weeks.  If you have not heard from us then please contact us. The fastest way to get your results is to register for My Chart.   IF you received an x-ray today, you will receive an invoice from [facility]. Please contact [facility] [facility] at [phone] with questions or concerns regarding your invoice.   IF you received labwork today, you will receive an invoice from [facility]. Please contact [facility] at [phone] with questions or concerns regarding your invoice.   Our billing staff will not be able to assist you with questions regarding bills from these companies.  You will be contacted with the lab results as soon as they are available. The fastest way to get your results is to activate your My Chart account. Instructions are located on the last page of this paperwork. If you have not heard from us regarding the results in 2 weeks, please contact this office.     Health Maintenance After Age 65 After age 65, you are at a higher risk for certain long-term diseases and infections as well as injuries from falls. Falls are a major cause of broken bones and head injuries in people who are older than age 65. Getting regular preventive care can help to keep you healthy and well. Preventive care includes getting regular testing and making lifestyle changes as recommended by your health care provider. Talk with your health care provider about:  Which screenings and tests you should have. A screening is a test that checks for a disease when you have no symptoms.  A diet and exercise plan that is right for you. What should I know about screenings and tests to prevent falls? Screening and testing are the best ways to find a health problem early. Early diagnosis and treatment give you the best chance of managing medical conditions that are common after age 65. Certain conditions and  lifestyle choices may make you more likely to have a fall. Your health care provider may recommend:  Regular vision checks. Poor vision and conditions such as cataracts can make you more likely to have a fall. If you wear glasses, make sure to get your prescription updated if your vision changes.  Medicine review. Work with your health care provider to regularly review all of the medicines you are taking, including over-the-counter medicines. Ask your health care provider about any side effects that may make you more likely to have a fall. Tell your health care provider if any medicines that you take make you feel dizzy or sleepy.  Osteoporosis screening. Osteoporosis is a condition that causes the bones to get weaker. This can make the bones weak and cause them to break more easily.  Blood pressure screening. Blood pressure changes and medicines to control blood pressure can make you feel dizzy.  Strength and balance checks. Your health care provider may recommend certain tests to check your strength and balance while standing, walking, or changing positions.  Foot health exam. Foot pain and numbness, as well as not wearing proper footwear, can make you more likely to have a fall.  Depression screening. You may be more likely to have a fall if you have a fear of falling, feel emotionally low, or feel unable to do activities that you used to do.  Alcohol use screening. Using too much alcohol can affect your balance and may make you more likely to   have a fall. What actions can I take to lower my risk of falls? General instructions  Talk with your health care provider about your risks for falling. Tell your health care provider if: ? You fall. Be sure to tell your health care provider about all falls, even ones that seem minor. ? You feel dizzy, sleepy, or off-balance.  Take over-the-counter and prescription medicines only as told by your health care provider. These include any  supplements.  Eat a healthy diet and maintain a healthy weight. A healthy diet includes low-fat dairy products, low-fat (lean) meats, and fiber from whole grains, beans, and lots of fruits and vegetables. Home safety  Remove any tripping hazards, such as rugs, cords, and clutter.  Install safety equipment such as grab bars in bathrooms and safety rails on stairs.  Keep rooms and walkways well-lit. Activity   Follow a regular exercise program to stay fit. This will help you maintain your balance. Ask your health care provider what types of exercise are appropriate for you.  If you need a cane or walker, use it as recommended by your health care provider.  Wear supportive shoes that have nonskid soles. Lifestyle  Do not drink alcohol if your health care provider tells you not to drink.  If you drink alcohol, limit how much you have: ? 0-1 drink a day for women. ? 0-2 drinks a day for men.  Be aware of how much alcohol is in your drink. In the [place] one drink equals one typical bottle of beer (12 oz), one-half glass of wine (5 oz), or one shot of hard liquor (1 oz).  Do not use any products that contain nicotine or tobacco, such as cigarettes and e-cigarettes. If you need help quitting, ask your health care provider. Summary  Having a healthy lifestyle and getting preventive care can help to protect your health and wellness after age 99.  Screening and testing are the best way to find a health problem early and help you avoid having a fall. Early diagnosis and treatment give you the best chance for managing medical conditions that are more common for people who are older than age 51.  Falls are a major cause of broken bones and head injuries in people who are older than age 42. Take precautions to prevent a fall at home.  Work with your health care provider to learn what changes you can make to improve your health and wellness and to prevent falls. This information is not intended  to replace advice given to you by your health care provider. Make sure you discuss any questions you have with your health care provider. Document Revised: 02/05/2019 Document Reviewed: 08/28/2017 Elsevier Patient Education  [place].  Hypertension, Adult High blood pressure (hypertension) is when the force of blood pumping through the arteries is too strong. The arteries are the blood vessels that carry blood from the heart throughout the body. Hypertension forces the heart to work harder to pump blood and may cause arteries to become narrow or stiff. Untreated or uncontrolled hypertension can cause a heart attack, heart failure, a stroke, kidney disease, and other problems. A blood pressure reading consists of a higher number over a lower number. Ideally, your blood pressure should be below 120/80. The first ("top") number is called the systolic pressure. It is a measure of the pressure in your arteries as your heart beats. The second ("bottom") number is called the diastolic pressure. It is a measure of the pressure in  your arteries as the heart relaxes. What are the causes? The exact cause of this condition is not known. There are some conditions that result in or are related to high blood pressure. What increases the risk? Some risk factors for high blood pressure are under your control. The following factors may make you more likely to develop this condition:  Smoking.  Having type 2 diabetes mellitus, high cholesterol, or both.  Not getting enough exercise or physical activity.  Being overweight.  Having too much fat, sugar, calories, or salt (sodium) in your diet.  Drinking too much alcohol. Some risk factors for high blood pressure may be difficult or impossible to change. Some of these factors include:  Having chronic kidney disease.  Having a family history of high blood pressure.  Age. Risk increases with age.  Race. You may be at higher risk if you are African  American.  Gender. Men are at higher risk than women before age 23. After age 56, women are at higher risk than men.  Having obstructive sleep apnea.  Stress. What are the signs or symptoms? High blood pressure may not cause symptoms. Very high blood pressure (hypertensive crisis) may cause:  Headache.  Anxiety.  Shortness of breath.  Nosebleed.  Nausea and vomiting.  Vision changes.  Severe chest pain.  Seizures. How is this diagnosed? This condition is diagnosed by measuring your blood pressure while you are seated, with your arm resting on a flat surface, your legs uncrossed, and your feet flat on the floor. The cuff of the blood pressure monitor will be placed directly against the skin of your upper arm at the level of your heart. It should be measured at least twice using the same arm. Certain conditions can cause a difference in blood pressure between your right and left arms. Certain factors can cause blood pressure readings to be lower or higher than normal for a short period of time:  When your blood pressure is higher when you are in a health care provider's office than when you are at home, this is called white coat hypertension. Most people with this condition do not need medicines.  When your blood pressure is higher at home than when you are in a health care provider's office, this is called masked hypertension. Most people with this condition may need medicines to control blood pressure. If you have a high blood pressure reading during one visit or you have normal blood pressure with other risk factors, you may be asked to:  Return on a different day to have your blood pressure checked again.  Monitor your blood pressure at home for 1 week or longer. If you are diagnosed with hypertension, you may have other blood or imaging tests to help your health care provider understand your overall risk for other conditions. How is this treated? This condition is treated by  making healthy lifestyle changes, such as eating healthy foods, exercising more, and reducing your alcohol intake. Your health care provider may prescribe medicine if lifestyle changes are not enough to get your blood pressure under control, and if:  Your systolic blood pressure is above 130.  Your diastolic blood pressure is above 80. Your personal target blood pressure may vary depending on your medical conditions, your age, and other factors. Follow these instructions at home: Eating and drinking   Eat a diet that is high in fiber and potassium, and low in sodium, added sugar, and fat. An example eating plan is called the DASH (  Dietary Approaches to Stop Hypertension) diet. To eat this way: ? Eat plenty of fresh fruits and vegetables. Try to fill one half of your plate at each meal with fruits and vegetables. ? Eat whole grains, such as whole-wheat pasta, brown rice, or whole-grain bread. Fill about one fourth of your plate with whole grains. ? Eat or drink low-fat dairy products, such as skim milk or low-fat yogurt. ? Avoid fatty cuts of meat, processed or cured meats, and poultry with skin. Fill about one fourth of your plate with lean proteins, such as fish, chicken without skin, beans, eggs, or tofu. ? Avoid pre-made and processed foods. These tend to be higher in sodium, added sugar, and fat.  Reduce your daily sodium intake. Most people with hypertension should eat less than 1,500 mg of sodium a day.  Do not drink alcohol if: ? Your health care provider tells you not to drink. ? You are pregnant, may be pregnant, or are planning to become pregnant.  If you drink alcohol: ? Limit how much you use to:  0-1 drink a day for women.  0-2 drinks a day for men. ? Be aware of how much alcohol is in your drink. In the [place] one drink equals one 12 oz bottle of beer (355 mL), one 5 oz glass of wine (148 mL), or one 1 oz glass of hard liquor (44 mL). Lifestyle   Work with your health  care provider to maintain a healthy body weight or to lose weight. Ask what an ideal weight is for you.  Get at least 30 minutes of exercise most days of the week. Activities may include walking, swimming, or biking.  Include exercise to strengthen your muscles (resistance exercise), such as Pilates or lifting weights, as part of your weekly exercise routine. Try to do these types of exercises for 30 minutes at least 3 days a week.  Do not use any products that contain nicotine or tobacco, such as cigarettes, e-cigarettes, and chewing tobacco. If you need help quitting, ask your health care provider.  Monitor your blood pressure at home as told by your health care provider.  Keep all follow-up visits as told by your health care provider. This is important. Medicines  Take over-the-counter and prescription medicines only as told by your health care provider. Follow directions carefully. Blood pressure medicines must be taken as prescribed.  Do not skip doses of blood pressure medicine. Doing this puts you at risk for problems and can make the medicine less effective.  Ask your health care provider about side effects or reactions to medicines that you should watch for. Contact a health care provider if you:  Think you are having a reaction to a medicine you are taking.  Have headaches that keep coming back (recurring).  Feel dizzy.  Have swelling in your ankles.  Have trouble with your vision. Get help right away if you:  Develop a severe headache or confusion.  Have unusual weakness or numbness.  Feel faint.  Have severe pain in your chest or abdomen.  Vomit repeatedly.  Have trouble breathing. Summary  Hypertension is when the force of blood pumping through your arteries is too strong. If this condition is not controlled, it may put you at risk for serious complications.  Your personal target blood pressure may vary depending on your medical conditions, your age, and  other factors. For most people, a normal blood pressure is less than 120/80.  Hypertension is treated with lifestyle changes, medicines,  or a combination of both. Lifestyle changes include losing weight, eating a healthy, low-sodium diet, exercising more, and limiting alcohol. This information is not intended to replace advice given to you by your health care provider. Make sure you discuss any questions you have with your health care provider. Document Revised: 06/25/2018 Document Reviewed: 06/25/2018 Elsevier Patient Education  2020 [employer].

## 2020-08-17 NOTE — Progress Notes (Signed)
Jacqueline Thompson 64 y.o.   Chief Complaint  Patient presents with  . Hypertension    per patient to see how medication for blood is working, it was changed    HISTORY OF PRESENT ILLNESS: This is a 64 y.o. female with history of hypertension here for follow-up. Presently taking amlodipine 10 mg daily and losartan-HCTZ 100-12.5 mg daily. Also taking rosuvastatin 10 mg daily. Fully vaccinated against Covid. No complaints or medical concerns today.  HPI   Prior to Admission medications   Medication Sig Start Date End Date Taking? Authorizing Provider  albuterol (VENTOLIN HFA) 108 (90 Base) MCG/ACT inhaler Inhale 2 puffs into the lungs every 4 (four) hours as needed for wheezing or shortness of breath (cough, shortness of breath or wheezing.). 01/14/20  Yes Torez Beauregard, Ines Bloomer, MD  amLODipine (NORVASC) 10 MG tablet Take 1 tablet (10 mg total) by mouth daily. 07/14/20 10/12/20 Yes Keelan Tripodi, Ines Bloomer, MD  fluticasone [facility]) 50 MCG/ACT nasal spray Place 2 sprays into both nostrils daily. 07/14/20  Yes Zurri Rudden, Ines Bloomer, MD  losartan-hydrochlorothiazide (HYZAAR) 100-12.5 MG tablet Take 1 tablet by mouth daily. 07/14/20  Yes Fredy Gladu, Ines Bloomer, MD  potassium chloride SA (KLOR-CON M20) 20 MEQ tablet Take 1 tablet (20 mEq total) by mouth 2 (two) times daily. 07/14/20  Yes Fisher Hargadon, Ines Bloomer, MD  rosuvastatin (CRESTOR) 10 MG tablet Take 1 tablet (10 mg total) by mouth daily. 07/14/20  Yes SagardiaInes Bloomer, MD    No Known Allergies  Patient Active Problem List   Diagnosis Date Noted  . Atherosclerosis of aorta ([place]) 01/08/2019  . Elevated LDL cholesterol level 05/20/2018  . Essential hypertension 02/22/2015    Past Medical History:  Diagnosis Date  . Allergy   . Hypertension     Past Surgical History:  Procedure Laterality Date  . ABDOMINAL HYSTERECTOMY  2007  . Birth Mark Removed from Face Left     Social History   Socioeconomic History  . Marital  status: Legally Separated    Spouse name: Not on file  . Number of children: 0  . Years of education: Not on file  . Highest education level: Not on file  Occupational History  . Occupation: distrubution specialist  Tobacco Use  . Smoking status: Former Smoker    Packs/day: 0.75    Years: 10.00    Pack years: 7.50    Start date: 1989    Quit date: 11/29/1997    Years since quitting: 22.7  . Smokeless tobacco: Never Used  . Tobacco comment: smoked a pack per week  Vaping Use  . Vaping Use: Never used  Substance and Sexual Activity  . Alcohol use: Yes    Alcohol/week: 4.0 standard drinks    Types: 3 Glasses of wine, 1 Shots of liquor per week    Comment: 2 drinks a week  . Drug use: No  . Sexual activity: Yes    Partners: Male    Birth control/protection: None  Other Topics Concern  . Not on file  Social History Narrative   Marital status: married   Children: none   Lives with: husband           Seatbelt: 100%   Guns in home: no         Exercise - active at work   Sleep - good   Social Determinants of Health   Financial Resource Strain:   . Difficulty of Paying Living Expenses: Not on file  Food Insecurity:   .  Worried About Charity fundraiser in the Last Year: Not on file  . Ran Out of Food in the Last Year: Not on file  Transportation Needs:   . Lack of Transportation (Medical): Not on file  . Lack of Transportation (Non-Medical): Not on file  Physical Activity:   . Days of Exercise per Week: Not on file  . Minutes of Exercise per Session: Not on file  Stress:   . Feeling of Stress : Not on file  Social Connections:   . Frequency of Communication with Friends and Family: Not on file  . Frequency of Social Gatherings with Friends and Family: Not on file  . Attends Religious Services: Not on file  . Active Member of Clubs or Organizations: Not on file  . Attends Archivist Meetings: Not on file  . Marital Status: Not on file  Intimate Partner  Violence:   . Fear of Current or Ex-Partner: Not on file  . Emotionally Abused: Not on file  . Physically Abused: Not on file  . Sexually Abused: Not on file    Family History  Problem Relation Age of Onset  . Heart disease Mother   . Cancer Father        colon,prostate and spine  . Stroke Brother   . Colon cancer Neg Hx   . Esophageal cancer Neg Hx   . Pancreatic cancer Neg Hx   . Rectal cancer Neg Hx   . Stomach cancer Neg Hx      Review of Systems  Constitutional: Negative.  Negative for chills and fever.  HENT: Negative.  Negative for congestion and sore throat.   Respiratory: Negative.  Negative for cough and shortness of breath.   Cardiovascular: Negative.  Negative for chest pain and palpitations.  Gastrointestinal: Negative.  Negative for abdominal pain, nausea and vomiting.  Genitourinary: Negative.  Negative for dysuria and hematuria.  Skin: Negative.  Negative for rash.  Neurological: Negative for dizziness and headaches.  All other systems reviewed and are negative.   Today's Vitals   08/17/20 1610 08/17/20 1634  BP: (!) 144/75 (!) 142/80  Pulse: 80   Resp: 16   Temp: 98.3 F (36.8 C)   TempSrc: Temporal   SpO2: 97%   Weight: 149 lb (67.6 kg)   Height: 5\' 5"  (1.651 m)    Body mass index is 24.79 kg/m. BP Readings from Last 3 Encounters:  08/17/20 (!) 142/80  07/14/20 136/78  01/14/20 (!) 156/82    Physical Exam Vitals reviewed.  Constitutional:      Appearance: Normal appearance.  HENT:     Head: Normocephalic.  Eyes:     Extraocular Movements: Extraocular movements intact.     Conjunctiva/sclera: Conjunctivae normal.     Pupils: Pupils are equal, round, and reactive to light.  Cardiovascular:     Rate and Rhythm: Normal rate and regular rhythm.     Pulses: Normal pulses.     Heart sounds: Normal heart sounds.  Pulmonary:     Effort: Pulmonary effort is normal.     Breath sounds: Normal breath sounds.  Musculoskeletal:        General:  Normal range of motion.     Cervical back: Normal range of motion and neck supple.     Right lower leg: No edema.     Left lower leg: No edema.  Skin:    General: Skin is warm and dry.     Capillary Refill: Capillary refill takes less than  2 seconds.  Neurological:     General: No focal deficit present.     Mental Status: She is alert and oriented to person, place, and time.  Psychiatric:        Mood and Affect: Mood normal.        Behavior: Behavior normal.    A total of 30 minutes was spent with the patient, greater than 50% of which was in counseling/coordination of care regarding hypertension and dyslipidemia and cardiovascular risks associated with these conditions, review of all medications, review of most recent office visit notes, review of most recent blood work results, education on nutrition, documentation, prognosis, and need for follow-up.   ASSESSMENT & PLAN: Essential hypertension Well-controlled hypertension.  Continue present medications.  No changes. Diet and nutrition discussed. Follow-up in 6 months.  Atherosclerosis of aorta (HCC) Presently on rosuvastatin 10 mg daily. Well-controlled hypertension. Diet and nutrition discussed.  Benedicta was seen today for hypertension.  Diagnoses and all orders for this visit:  Essential hypertension  Atherosclerosis of aorta (HCC)  Hypokalemia  Dyslipidemia  Prediabetes    Patient Instructions       If you have lab work done today you will be contacted with your lab results within the next 2 weeks.  If you have not heard from [place] then please contact us. The fastest way to get your results is to register for My Chart.   IF you received an x-ray today, you will receive an invoice from [facility]. Please contact [facility] at [phone] with questions or concerns regarding your invoice.   IF you received labwork today, you will receive an invoice from [facility]. Please contact [facility] at  [phone] with questions or concerns regarding your invoice.   Our billing staff will not be able to assist you with questions regarding bills from these companies.  You will be contacted with the lab results as soon as they are available. The fastest way to get your results is to activate your My Chart account. Instructions are located on the last page of this paperwork. If you have not heard from [place] regarding the results in 2 weeks, please contact this office.     Health Maintenance After Age 21 After age 58, you are at a higher risk for certain long-term diseases and infections as well as injuries from falls. Falls are a major cause of broken bones and head injuries in people who are older than age 66. Getting regular preventive care can help to keep you healthy and well. Preventive care includes getting regular testing and making lifestyle changes as recommended by your health care provider. Talk with your health care provider about:  Which screenings and tests you should have. A screening is a test that checks for a disease when you have no symptoms.  A diet and exercise plan that is right for you. What should I know about screenings and tests to prevent falls? Screening and testing are the best ways to find a health problem early. Early diagnosis and treatment give you the best chance of managing medical conditions that are common after age 61. Certain conditions and lifestyle choices may make you more likely to have a fall. Your health care provider may recommend:  Regular vision checks. Poor vision and conditions such as cataracts can make you more likely to have a fall. If you wear glasses, make sure to get your prescription updated if your vision changes.  Medicine review. Work with your health care provider to regularly review  all of the medicines you are taking, including over-the-counter medicines. Ask your health care provider about any side effects that may make you more likely  to have a fall. Tell your health care provider if any medicines that you take make you feel dizzy or sleepy.  Osteoporosis screening. Osteoporosis is a condition that causes the bones to get weaker. This can make the bones weak and cause them to break more easily.  Blood pressure screening. Blood pressure changes and medicines to control blood pressure can make you feel dizzy.  Strength and balance checks. Your health care provider may recommend certain tests to check your strength and balance while standing, walking, or changing positions.  Foot health exam. Foot pain and numbness, as well as not wearing proper footwear, can make you more likely to have a fall.  Depression screening. You may be more likely to have a fall if you have a fear of falling, feel emotionally low, or feel unable to do activities that you used to do.  Alcohol use screening. Using too much alcohol can affect your balance and may make you more likely to have a fall. What actions can I take to lower my risk of falls? General instructions  Talk with your health care provider about your risks for falling. Tell your health care provider if: ? You fall. Be sure to tell your health care provider about all falls, even ones that seem minor. ? You feel dizzy, sleepy, or off-balance.  Take over-the-counter and prescription medicines only as told by your health care provider. These include any supplements.  Eat a healthy diet and maintain a healthy weight. A healthy diet includes low-fat dairy products, low-fat (lean) meats, and fiber from whole grains, beans, and lots of fruits and vegetables. Home safety  Remove any tripping hazards, such as rugs, cords, and clutter.  Install safety equipment such as grab bars in bathrooms and safety rails on stairs.  Keep rooms and walkways well-lit. Activity   Follow a regular exercise program to stay fit. This will help you maintain your balance. Ask your health care provider what  types of exercise are appropriate for you.  If you need a cane or walker, use it as recommended by your health care provider.  Wear supportive shoes that have nonskid soles. Lifestyle  Do not drink alcohol if your health care provider tells you not to drink.  If you drink alcohol, limit how much you have: ? 0-1 drink a day for women. ? 0-2 drinks a day for men.  Be aware of how much alcohol is in your drink. In the [place] one drink equals one typical bottle of beer (12 oz), one-half glass of wine (5 oz), or one shot of hard liquor (1 oz).  Do not use any products that contain nicotine or tobacco, such as cigarettes and e-cigarettes. If you need help quitting, ask your health care provider. Summary  Having a healthy lifestyle and getting preventive care can help to protect your health and wellness after age 70.  Screening and testing are the best way to find a health problem early and help you avoid having a fall. Early diagnosis and treatment give you the best chance for managing medical conditions that are more common for people who are older than age 18.  Falls are a major cause of broken bones and head injuries in people who are older than age 43. Take precautions to prevent a fall at home.  Work with your health care provider  to learn what changes you can make to improve your health and wellness and to prevent falls. This information is not intended to replace advice given to you by your health care provider. Make sure you discuss any questions you have with your health care provider. Document Revised: 02/05/2019 Document Reviewed: 08/28/2017 Elsevier Patient Education  [place].  Hypertension, Adult High blood pressure (hypertension) is when the force of blood pumping through the arteries is too strong. The arteries are the blood vessels that carry blood from the heart throughout the body. Hypertension forces the heart to work harder to pump blood and may cause arteries  to become narrow or stiff. Untreated or uncontrolled hypertension can cause a heart attack, heart failure, a stroke, kidney disease, and other problems. A blood pressure reading consists of a higher number over a lower number. Ideally, your blood pressure should be below 120/80. The first ("top") number is called the systolic pressure. It is a measure of the pressure in your arteries as your heart beats. The second ("bottom") number is called the diastolic pressure. It is a measure of the pressure in your arteries as the heart relaxes. What are the causes? The exact cause of this condition is not known. There are some conditions that result in or are related to high blood pressure. What increases the risk? Some risk factors for high blood pressure are under your control. The following factors may make you more likely to develop this condition:  Smoking.  Having type 2 diabetes mellitus, high cholesterol, or both.  Not getting enough exercise or physical activity.  Being overweight.  Having too much fat, sugar, calories, or salt (sodium) in your diet.  Drinking too much alcohol. Some risk factors for high blood pressure may be difficult or impossible to change. Some of these factors include:  Having chronic kidney disease.  Having a family history of high blood pressure.  Age. Risk increases with age.  Race. You may be at higher risk if you are African American.  Gender. Men are at higher risk than women before age 81. After age 47, women are at higher risk than men.  Having obstructive sleep apnea.  Stress. What are the signs or symptoms? High blood pressure may not cause symptoms. Very high blood pressure (hypertensive crisis) may cause:  Headache.  Anxiety.  Shortness of breath.  Nosebleed.  Nausea and vomiting.  Vision changes.  Severe chest pain.  Seizures. How is this diagnosed? This condition is diagnosed by measuring your blood pressure while you are seated,  with your arm resting on a flat surface, your legs uncrossed, and your feet flat on the floor. The cuff of the blood pressure monitor will be placed directly against the skin of your upper arm at the level of your heart. It should be measured at least twice using the same arm. Certain conditions can cause a difference in blood pressure between your right and left arms. Certain factors can cause blood pressure readings to be lower or higher than normal for a short period of time:  When your blood pressure is higher when you are in a health care provider's office than when you are at home, this is called white coat hypertension. Most people with this condition do not need medicines.  When your blood pressure is higher at home than when you are in a health care provider's office, this is called masked hypertension. Most people with this condition may need medicines to control blood pressure. If you  have a high blood pressure reading during one visit or you have normal blood pressure with other risk factors, you may be asked to:  Return on a different day to have your blood pressure checked again.  Monitor your blood pressure at home for 1 week or longer. If you are diagnosed with hypertension, you may have other blood or imaging tests to help your health care provider understand your overall risk for other conditions. How is this treated? This condition is treated by making healthy lifestyle changes, such as eating healthy foods, exercising more, and reducing your alcohol intake. Your health care provider may prescribe medicine if lifestyle changes are not enough to get your blood pressure under control, and if:  Your systolic blood pressure is above 130.  Your diastolic blood pressure is above 80. Your personal target blood pressure may vary depending on your medical conditions, your age, and other factors. Follow these instructions at home: Eating and drinking   Eat a diet that is high in fiber  and potassium, and low in sodium, added sugar, and fat. An example eating plan is called the DASH (Dietary Approaches to Stop Hypertension) diet. To eat this way: ? Eat plenty of fresh fruits and vegetables. Try to fill one half of your plate at each meal with fruits and vegetables. ? Eat whole grains, such as whole-wheat pasta, brown rice, or whole-grain bread. Fill about one fourth of your plate with whole grains. ? Eat or drink low-fat dairy products, such as skim milk or low-fat yogurt. ? Avoid fatty cuts of meat, processed or cured meats, and poultry with skin. Fill about one fourth of your plate with lean proteins, such as fish, chicken without skin, beans, eggs, or tofu. ? Avoid pre-made and processed foods. These tend to be higher in sodium, added sugar, and fat.  Reduce your daily sodium intake. Most people with hypertension should eat less than 1,500 mg of sodium a day.  Do not drink alcohol if: ? Your health care provider tells you not to drink. ? You are pregnant, may be pregnant, or are planning to become pregnant.  If you drink alcohol: ? Limit how much you use to:  0-1 drink a day for women.  0-2 drinks a day for men. ? Be aware of how much alcohol is in your drink. In the [place] one drink equals one 12 oz bottle of beer (355 mL), one 5 oz glass of wine (148 mL), or one 1 oz glass of hard liquor (44 mL). Lifestyle   Work with your health care provider to maintain a healthy body weight or to lose weight. Ask what an ideal weight is for you.  Get at least 30 minutes of exercise most days of the week. Activities may include walking, swimming, or biking.  Include exercise to strengthen your muscles (resistance exercise), such as Pilates or lifting weights, as part of your weekly exercise routine. Try to do these types of exercises for 30 minutes at least 3 days a week.  Do not use any products that contain nicotine or tobacco, such as cigarettes, e-cigarettes, and chewing  tobacco. If you need help quitting, ask your health care provider.  Monitor your blood pressure at home as told by your health care provider.  Keep all follow-up visits as told by your health care provider. This is important. Medicines  Take over-the-counter and prescription medicines only as told by your health care provider. Follow directions carefully. Blood pressure medicines must be taken as  prescribed.  Do not skip doses of blood pressure medicine. Doing this puts you at risk for problems and can make the medicine less effective.  Ask your health care provider about side effects or reactions to medicines that you should watch for. Contact a health care provider if you:  Think you are having a reaction to a medicine you are taking.  Have headaches that keep coming back (recurring).  Feel dizzy.  Have swelling in your ankles.  Have trouble with your vision. Get help right away if you:  Develop a severe headache or confusion.  Have unusual weakness or numbness.  Feel faint.  Have severe pain in your chest or abdomen.  Vomit repeatedly.  Have trouble breathing. Summary  Hypertension is when the force of blood pumping through your arteries is too strong. If this condition is not controlled, it may put you at risk for serious complications.  Your personal target blood pressure may vary depending on your medical conditions, your age, and other factors. For most people, a normal blood pressure is less than 120/80.  Hypertension is treated with lifestyle changes, medicines, or a combination of both. Lifestyle changes include losing weight, eating a healthy, low-sodium diet, exercising more, and limiting alcohol. This information is not intended to replace advice given to you by your health care provider. Make sure you discuss any questions you have with your health care provider. Document Revised: 06/25/2018 Document Reviewed: 06/25/2018 Elsevier Patient Education  2020  Elsevier Inc.      Agustina Caroli, MD [facility]

## 2020-08-17 NOTE — Assessment & Plan Note (Signed)
Presently on rosuvastatin 10 mg daily. Well-controlled hypertension. Diet and nutrition discussed.

## 2020-08-17 NOTE — Assessment & Plan Note (Signed)
Well-controlled hypertension.  Continue present medications.  No changes. Diet and nutrition discussed. Follow-up in 6 months. 

## 2020-08-22 IMAGING — MG MM DIGITAL DIAGNOSTIC UNILAT*L* W/ TOMO W/ CAD
4 series · 4 of 12 positions shown · non-contrast
Comparison: Previous exams.

CLINICAL DATA: Screening recall for possible left breast asymmetry.

EXAM:
DIGITAL DIAGNOSTIC UNILATERAL LEFT MAMMOGRAM WITH TOMO AND CAD;
ULTRASOUND LEFT BREAST LIMITED

[L CC synth-2D]
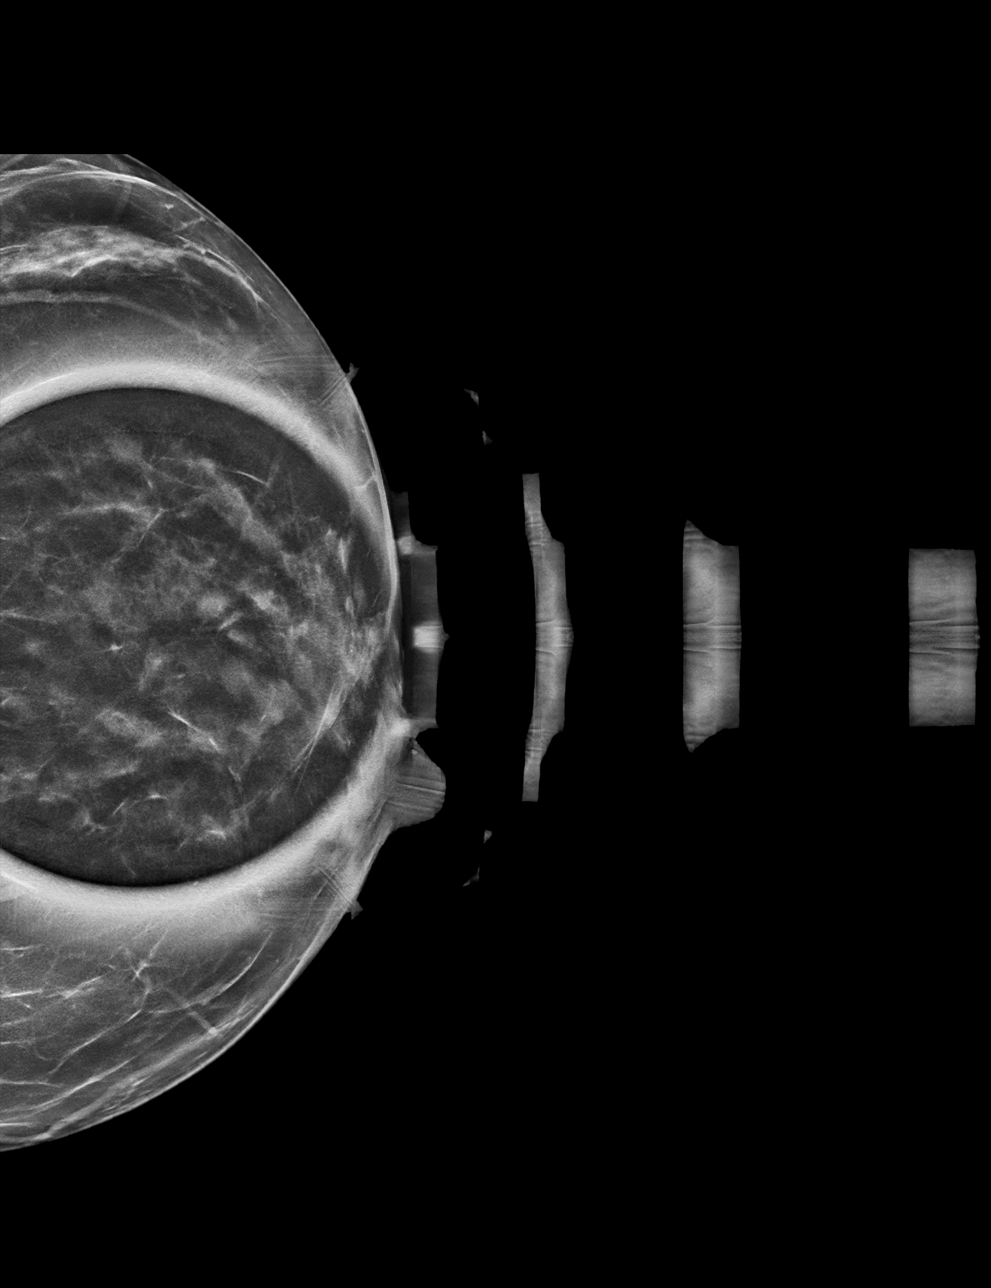

[L MLO synth-2D]
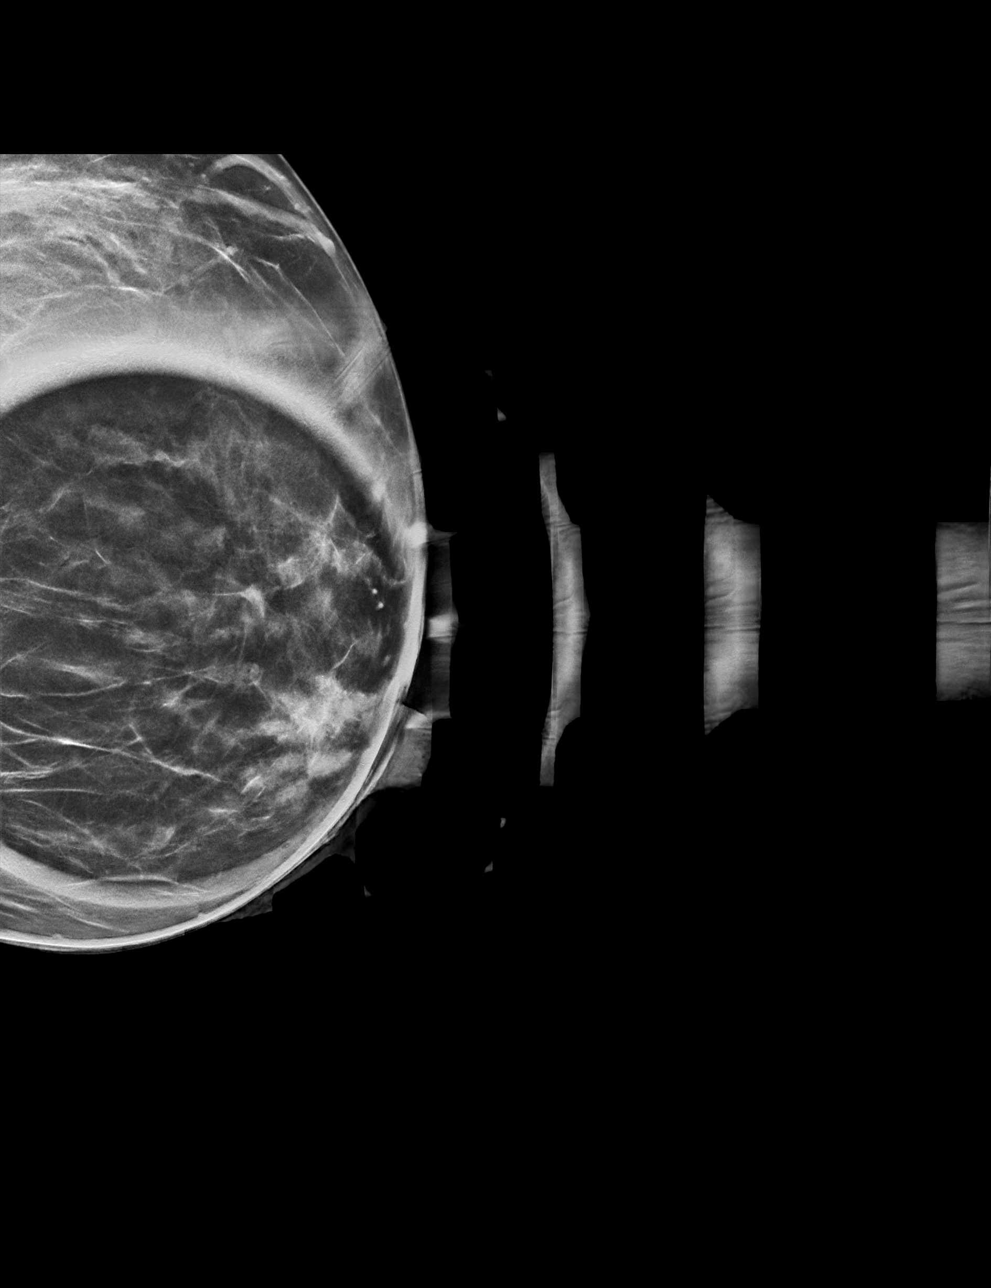

[L MLO tomo · tomo slice 34/67.0]
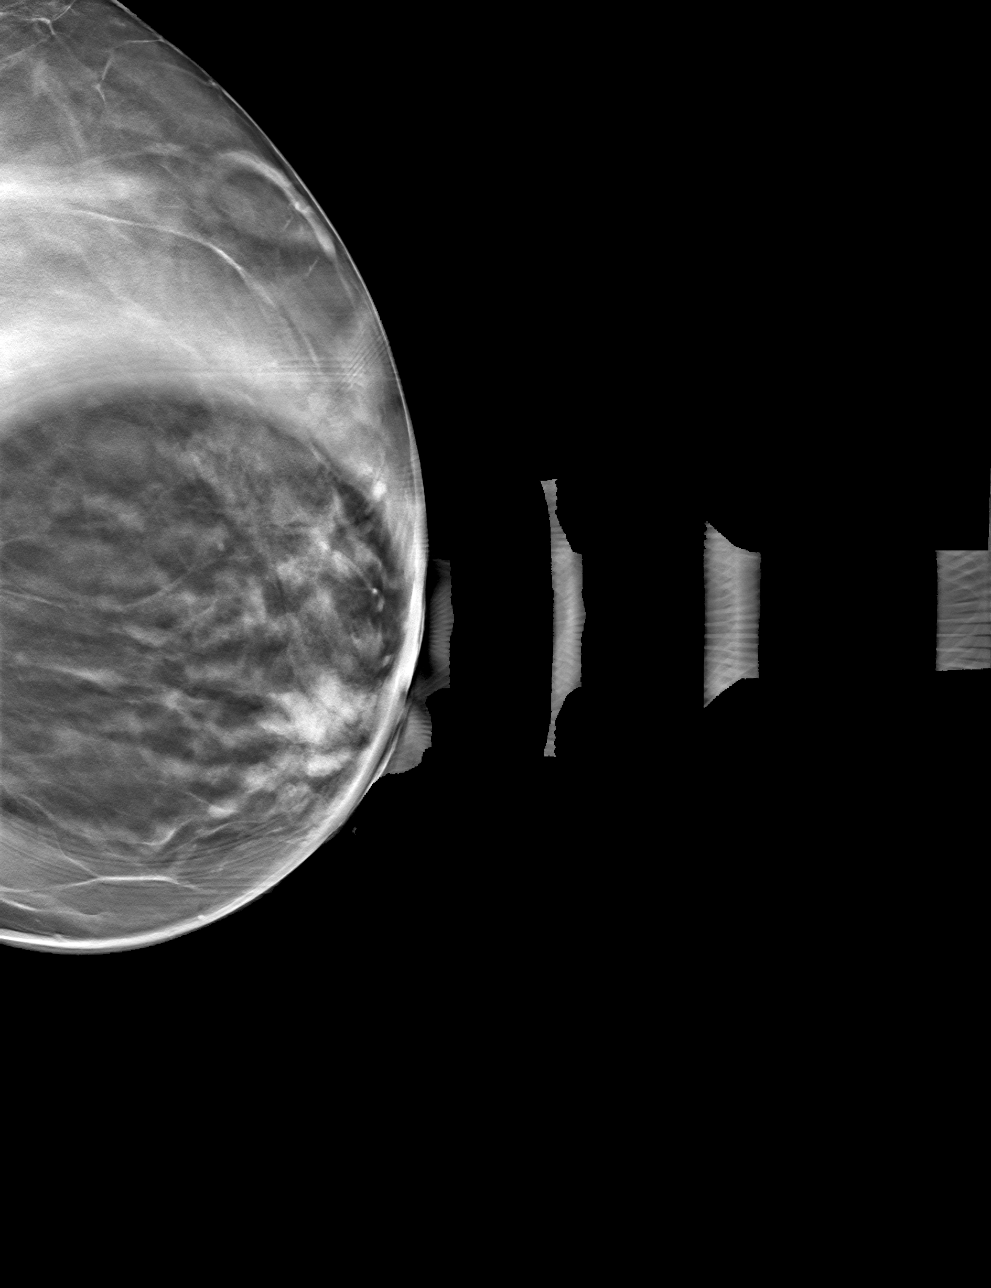

[L CC tomo · tomo slice 27/53.0]
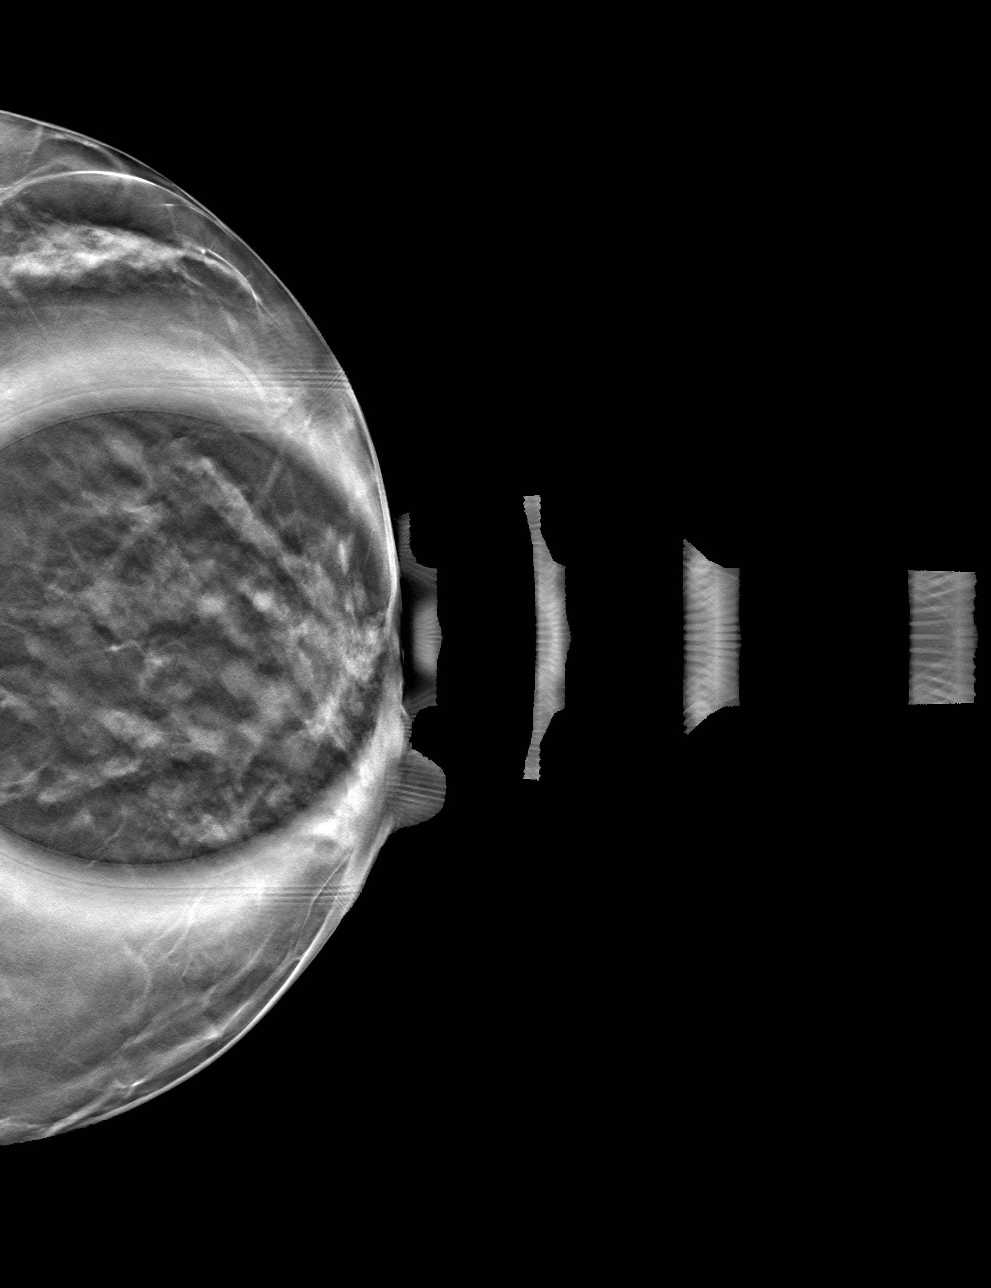

[4 of 12 positions shown; findings below may reference images not displayed]

ACR Breast Density Category b: There are scattered areas of
fibroglandular density.
FINDINGS: Spot compression tomograms were performed over the central to
slightly upper left breast. No definite persistent masses or areas
of distortion identified, only heterogeneous fibroglandular tissue
present in this location.

Mammographic images were processed with CAD.

Targeted ultrasound of the central and upper left breast was
performed. No suspicious masses or abnormality seen, only
heterogeneous fibroglandular tissue and normal ducts identified.
IMPRESSION: No findings of malignancy in the left breast.

RECOMMENDATION:
Screening mammogram in one year.(Code:SP-A-WFN)

I have discussed the findings and recommendations with the patient.
If applicable, a reminder letter will be sent to the patient
regarding the next appointment.

BI-RADS CATEGORY  1: Negative.

## 2021-02-15 ENCOUNTER — Ambulatory Visit: Payer: Self-pay | Admitting: Emergency Medicine

## 2021-02-15 ENCOUNTER — Ambulatory Visit: Payer: BC Managed Care – PPO | Admitting: Emergency Medicine

## 2021-02-20 ENCOUNTER — Ambulatory Visit: Payer: BC Managed Care – PPO | Admitting: Emergency Medicine

## 2021-02-20 ENCOUNTER — Encounter: Payer: Self-pay | Admitting: Emergency Medicine

## 2021-02-20 ENCOUNTER — Other Ambulatory Visit: Payer: Self-pay

## 2021-02-20 ENCOUNTER — Other Ambulatory Visit: Payer: Self-pay | Admitting: Emergency Medicine

## 2021-02-20 VITALS — BP 138/70 | HR 75 | Temp 98.4°F | Ht 65.0 in | Wt 143.8 lb

## 2021-02-20 DIAGNOSIS — R944 Abnormal results of kidney function studies: Secondary | ICD-10-CM

## 2021-02-20 DIAGNOSIS — I10 Essential (primary) hypertension: Secondary | ICD-10-CM

## 2021-02-20 DIAGNOSIS — E785 Hyperlipidemia, unspecified: Secondary | ICD-10-CM | POA: Diagnosis not present

## 2021-02-20 DIAGNOSIS — I700 Atherosclerosis of aorta: Secondary | ICD-10-CM | POA: Diagnosis not present

## 2021-02-20 LAB — LIPID PANEL
Cholesterol: 127 mg/dL (ref 0–200)
HDL: 51.5 mg/dL (ref >39.00)
LDL Cholesterol: 65 mg/dL (ref 0–99)
NonHDL: 75.86
Total CHOL/HDL Ratio: 2
Triglycerides: 52 mg/dL (ref 0.0–149.0)
VLDL: 10.4 mg/dL (ref 0.0–40.0)

## 2021-02-20 LAB — COMPREHENSIVE METABOLIC PANEL
ALT: 13 U/L (ref 0–35)
AST: 17 U/L (ref 0–37)
Albumin: 3.8 g/dL (ref 3.5–5.2)
Alkaline Phosphatase: 60 U/L (ref 39–117)
BUN: 22 mg/dL (ref 6–23)
CO2: 31 mEq/L (ref 19–32)
Calcium: 9.8 mg/dL (ref 8.4–10.5)
Chloride: 107 mEq/L (ref 96–112)
Creatinine, Ser: 1.3 mg/dL — ABNORMAL HIGH (ref 0.40–1.20)
GFR: 42.65 mL/min — ABNORMAL LOW (ref >60.00)
Glucose, Bld: 91 mg/dL (ref 70–99)
Potassium: 3.6 mEq/L (ref 3.5–5.1)
Sodium: 145 mEq/L (ref 135–145)
Total Bilirubin: 0.6 mg/dL (ref 0.2–1.2)
Total Protein: 7.3 g/dL (ref 6.0–8.3)

## 2021-02-20 MED ORDER — ROSUVASTATIN CALCIUM 10 MG PO TABS: 10.0000 mg | ORAL_TABLET | Freq: Every day | ORAL | 3 refills | Status: DC

## 2021-02-20 NOTE — Assessment & Plan Note (Signed)
Well-controlled hypertension. Continue amlodipine 10 mg daily and Hyzaar 100-12.5 mg daily. Stop potassium chloride tablets. Blood work done today. Diet and nutrition discussed. Follow-up in 6 months.

## 2021-02-20 NOTE — Assessment & Plan Note (Signed)
Good weight.  Continue rosuvastatin 10 mg daily. Diet and nutrition discussed as well as importance of physical exercise. Fasting lipid profile done today. Follow-up in 6 months.

## 2021-02-20 NOTE — Patient Instructions (Signed)

## 2021-02-20 NOTE — Progress Notes (Signed)
Jacqueline Thompson 65 y.o.   Chief Complaint  Patient presents with  . Hypertension    Follow up   . Medication Refill     rosuvastatin    HISTORY OF PRESENT ILLNESS: This is a 65 y.o. female with history of hypertension and dyslipidemia here for follow-up and medication refill. #1 hypertension: On amlodipine 10 mg and Hyzaar 100-12.5 mg daily. Advised to stop taking potassium supplements. #2 dyslipidemia: On rosuvastatin 10 mg daily. #3 history of intermittent asthma, uses albuterol inhaler infrequently. Staying physically active.  Eating better. Has no complaints or medical concerns today. Up-to-date with both mammogram and colonoscopy.  HPI   Prior to Admission medications   Medication Sig Start Date End Date Taking? Authorizing Provider  albuterol (VENTOLIN HFA) 108 (90 Base) MCG/ACT inhaler Inhale 2 puffs into the lungs every 4 (four) hours as needed for wheezing or shortness of breath (cough, shortness of breath or wheezing.). 01/14/20  Yes Irisa Grimsley, Ines Bloomer, MD  fluticasone [facility]) 50 MCG/ACT nasal spray Place 2 sprays into both nostrils daily. 07/14/20  Yes Jiaire Rosebrook, Ines Bloomer, MD  losartan-hydrochlorothiazide (HYZAAR) 100-12.5 MG tablet Take 1 tablet by mouth daily. 07/14/20  Yes Macaela Presas, Ines Bloomer, MD  potassium chloride SA (KLOR-CON M20) 20 MEQ tablet Take 1 tablet (20 mEq total) by mouth 2 (two) times daily. 07/14/20  Yes Rogene Meth, Ines Bloomer, MD  rosuvastatin (CRESTOR) 10 MG tablet Take 1 tablet (10 mg total) by mouth daily. 07/14/20  Yes Lilliemae Fruge, Ines Bloomer, MD  amLODipine (NORVASC) 10 MG tablet Take 1 tablet (10 mg total) by mouth daily. 07/14/20 10/12/20  Horald Pollen, MD    No Known Allergies  Patient Active Problem List   Diagnosis Date Noted  . Atherosclerosis of aorta ([place]) 01/08/2019  . Elevated LDL cholesterol level 05/20/2018  . Essential hypertension 02/22/2015    Past Medical History:  Diagnosis Date  . Allergy   .  Hypertension     Past Surgical History:  Procedure Laterality Date  . ABDOMINAL HYSTERECTOMY  2007  . Birth Mark Removed from Face Left     Social History   Socioeconomic History  . Marital status: Legally Separated    Spouse name: Not on file  . Number of children: 0  . Years of education: Not on file  . Highest education level: Not on file  Occupational History  . Occupation: distrubution specialist  Tobacco Use  . Smoking status: Former Smoker    Packs/day: 0.75    Years: 10.00    Pack years: 7.50    Start date: 1989    Quit date: 11/29/1997    Years since quitting: 23.2  . Smokeless tobacco: Never Used  . Tobacco comment: smoked a pack per week  Vaping Use  . Vaping Use: Never used  Substance and Sexual Activity  . Alcohol use: Yes    Alcohol/week: 4.0 standard drinks    Types: 3 Glasses of wine, 1 Shots of liquor per week    Comment: 2 drinks a week  . Drug use: No  . Sexual activity: Yes    Partners: Male    Birth control/protection: None  Other Topics Concern  . Not on file  Social History Narrative   Marital status: married   Children: none   Lives with: husband           Seatbelt: 100%   Guns in home: no         Exercise - active at work   Sleep -  good   Social Determinants of Radio broadcast assistant Strain: Not on file  Food Insecurity: Not on file  Transportation Needs: Not on file  Physical Activity: Not on file  Stress: Not on file  Social Connections: Not on file  Intimate Partner Violence: Not on file    Family History  Problem Relation Age of Onset  . Heart disease Mother   . Cancer Father        colon,prostate and spine  . Stroke Brother   . Colon cancer Neg Hx   . Esophageal cancer Neg Hx   . Pancreatic cancer Neg Hx   . Rectal cancer Neg Hx   . Stomach cancer Neg Hx      Review of Systems  Constitutional: Negative.  Negative for chills and fever.  HENT: Negative.  Negative for congestion and sore throat.    Respiratory: Negative.  Negative for cough and shortness of breath.   Cardiovascular: Negative.  Negative for chest pain and palpitations.  Gastrointestinal: Negative for abdominal pain, diarrhea, nausea and vomiting.  Genitourinary: Negative.  Negative for dysuria and hematuria.  Skin: Negative.  Negative for rash.  Neurological: Negative.  Negative for dizziness and headaches.  All other systems reviewed and are negative.   Today's Vitals   02/20/21 0955  BP: (!) 152/80  Pulse: 75  Temp: 98.4 F (36.9 C)  TempSrc: Oral  SpO2: 98%  Weight: 143 lb 12.8 oz (65.2 kg)  Height: 5\' 5"  (1.651 m)   Body mass index is 23.93 kg/m. BP Readings from Last 3 Encounters:  02/20/21 (!) 152/80  08/17/20 (!) 142/80  07/14/20 136/78    Physical Exam Vitals reviewed.  Constitutional:      Appearance: Normal appearance.  HENT:     Head: Normocephalic.  Eyes:     Extraocular Movements: Extraocular movements intact.     Conjunctiva/sclera: Conjunctivae normal.     Pupils: Pupils are equal, round, and reactive to light.  Cardiovascular:     Rate and Rhythm: Normal rate and regular rhythm.     Pulses: Normal pulses.     Heart sounds: Normal heart sounds.  Pulmonary:     Effort: Pulmonary effort is normal.     Breath sounds: Normal breath sounds.  Musculoskeletal:        General: Normal range of motion.     Cervical back: Normal range of motion and neck supple.  Skin:    General: Skin is warm and dry.     Capillary Refill: Capillary refill takes less than 2 seconds.  Neurological:     General: No focal deficit present.     Mental Status: She is alert and oriented to person, place, and time.  Psychiatric:        Mood and Affect: Mood normal.        Behavior: Behavior normal.    A total of 30 minutes was spent with the patient and counseling/coordination of care regarding hypertension and dyslipidemia and cardiovascular risks associated with these conditions, review of all  medications, education on nutrition, health maintenance items, review of most recent office visit notes, review of most recent blood work results, prognosis, documentation and need for follow-up.   ASSESSMENT & PLAN: Essential hypertension Well-controlled hypertension. Continue amlodipine 10 mg daily and Hyzaar 100-12.5 mg daily. Stop potassium chloride tablets. Blood work done today. Diet and nutrition discussed. Follow-up in 6 months.  Dyslipidemia Good weight.  Continue rosuvastatin 10 mg daily. Diet and nutrition discussed as well as  importance of physical exercise. Fasting lipid profile done today. Follow-up in 6 months.  Jacqueline Thompson was seen today for hypertension and medication refill.  Diagnoses and all orders for this visit:  Essential hypertension -     Comprehensive metabolic panel  Dyslipidemia -     rosuvastatin (CRESTOR) 10 MG tablet; Take 1 tablet (10 mg total) by mouth daily. -     Lipid panel  Atherosclerosis of aorta [facility])    Patient Instructions   Hypertension, Adult High blood pressure (hypertension) is when the force of blood pumping through the arteries is too strong. The arteries are the blood vessels that carry blood from the heart throughout the body. Hypertension forces the heart to work harder to pump blood and may cause arteries to become narrow or stiff. Untreated or uncontrolled hypertension can cause a heart attack, heart failure, a stroke, kidney disease, and other problems. A blood pressure reading consists of a higher number over a lower number. Ideally, your blood pressure should be below 120/80. The first ("top") number is called the systolic pressure. It is a measure of the pressure in your arteries as your heart beats. The second ("bottom") number is called the diastolic pressure. It is a measure of the pressure in your arteries as the heart relaxes. What are the causes? The exact cause of this condition is not known. There are some conditions  that result in or are related to high blood pressure. What increases the risk? Some risk factors for high blood pressure are under your control. The following factors may make you more likely to develop this condition:  Smoking.  Having type 2 diabetes mellitus, high cholesterol, or both.  Not getting enough exercise or physical activity.  Being overweight.  Having too much fat, sugar, calories, or salt (sodium) in your diet.  Drinking too much alcohol. Some risk factors for high blood pressure may be difficult or impossible to change. Some of these factors include:  Having chronic kidney disease.  Having a family history of high blood pressure.  Age. Risk increases with age.  Race. You may be at higher risk if you are African American.  Gender. Men are at higher risk than women before age 21. After age 1, women are at higher risk than men.  Having obstructive sleep apnea.  Stress. What are the signs or symptoms? High blood pressure may not cause symptoms. Very high blood pressure (hypertensive crisis) may cause:  Headache.  Anxiety.  Shortness of breath.  Nosebleed.  Nausea and vomiting.  Vision changes.  Severe chest pain.  Seizures. How is this diagnosed? This condition is diagnosed by measuring your blood pressure while you are seated, with your arm resting on a flat surface, your legs uncrossed, and your feet flat on the floor. The cuff of the blood pressure monitor will be placed directly against the skin of your upper arm at the level of your heart. It should be measured at least twice using the same arm. Certain conditions can cause a difference in blood pressure between your right and left arms. Certain factors can cause blood pressure readings to be lower or higher than normal for a short period of time:  When your blood pressure is higher when you are in a health care provider's office than when you are at home, this is called white coat hypertension.  Most people with this condition do not need medicines.  When your blood pressure is higher at home than when you are in a health  care provider's office, this is called masked hypertension. Most people with this condition may need medicines to control blood pressure. If you have a high blood pressure reading during one visit or you have normal blood pressure with other risk factors, you may be asked to:  Return on a different day to have your blood pressure checked again.  Monitor your blood pressure at home for 1 week or longer. If you are diagnosed with hypertension, you may have other blood or imaging tests to help your health care provider understand your overall risk for other conditions. How is this treated? This condition is treated by making healthy lifestyle changes, such as eating healthy foods, exercising more, and reducing your alcohol intake. Your health care provider may prescribe medicine if lifestyle changes are not enough to get your blood pressure under control, and if:  Your systolic blood pressure is above 130.  Your diastolic blood pressure is above 80. Your personal target blood pressure may vary depending on your medical conditions, your age, and other factors. Follow these instructions at home: Eating and drinking  Eat a diet that is high in fiber and potassium, and low in sodium, added sugar, and fat. An example eating plan is called the DASH (Dietary Approaches to Stop Hypertension) diet. To eat this way: ? Eat plenty of fresh fruits and vegetables. Try to fill one half of your plate at each meal with fruits and vegetables. ? Eat whole grains, such as whole-wheat pasta, brown rice, or whole-grain bread. Fill about one fourth of your plate with whole grains. ? Eat or drink low-fat dairy products, such as skim milk or low-fat yogurt. ? Avoid fatty cuts of meat, processed or cured meats, and poultry with skin. Fill about one fourth of your plate with lean proteins, such  as fish, chicken without skin, beans, eggs, or tofu. ? Avoid pre-made and processed foods. These tend to be higher in sodium, added sugar, and fat.  Reduce your daily sodium intake. Most people with hypertension should eat less than 1,500 mg of sodium a day.  Do not drink alcohol if: ? Your health care provider tells you not to drink. ? You are pregnant, may be pregnant, or are planning to become pregnant.  If you drink alcohol: ? Limit how much you use to:  0-1 drink a day for women.  0-2 drinks a day for men. ? Be aware of how much alcohol is in your drink. In the [place] one drink equals one 12 oz bottle of beer (355 mL), one 5 oz glass of wine (148 mL), or one 1 oz glass of hard liquor (44 mL).   Lifestyle  Work with your health care provider to maintain a healthy body weight or to lose weight. Ask what an ideal weight is for you.  Get at least 30 minutes of exercise most days of the week. Activities may include walking, swimming, or biking.  Include exercise to strengthen your muscles (resistance exercise), such as Pilates or lifting weights, as part of your weekly exercise routine. Try to do these types of exercises for 30 minutes at least 3 days a week.  Do not use any products that contain nicotine or tobacco, such as cigarettes, e-cigarettes, and chewing tobacco. If you need help quitting, ask your health care provider.  Monitor your blood pressure at home as told by your health care provider.  Keep all follow-up visits as told by your health care provider. This is important.   Medicines  Take over-the-counter and prescription medicines only as told by your health care provider. Follow directions carefully. Blood pressure medicines must be taken as prescribed.  Do not skip doses of blood pressure medicine. Doing this puts you at risk for problems and can make the medicine less effective.  Ask your health care provider about side effects or reactions to medicines that you  should watch for. Contact a health care provider if you:  Think you are having a reaction to a medicine you are taking.  Have headaches that keep coming back (recurring).  Feel dizzy.  Have swelling in your ankles.  Have trouble with your vision. Get help right away if you:  Develop a severe headache or confusion.  Have unusual weakness or numbness.  Feel faint.  Have severe pain in your chest or abdomen.  Vomit repeatedly.  Have trouble breathing. Summary  Hypertension is when the force of blood pumping through your arteries is too strong. If this condition is not controlled, it may put you at risk for serious complications.  Your personal target blood pressure may vary depending on your medical conditions, your age, and other factors. For most people, a normal blood pressure is less than 120/80.  Hypertension is treated with lifestyle changes, medicines, or a combination of both. Lifestyle changes include losing weight, eating a healthy, low-sodium diet, exercising more, and limiting alcohol. This information is not intended to replace advice given to you by your health care provider. Make sure you discuss any questions you have with your health care provider. Document Revised: 06/25/2018 Document Reviewed: 06/25/2018 Elsevier Patient Education  2021 [place], MD [facility]

## 2021-07-15 ENCOUNTER — Other Ambulatory Visit: Payer: Self-pay | Admitting: Emergency Medicine

## 2021-07-15 DIAGNOSIS — E876 Hypokalemia: Secondary | ICD-10-CM

## 2021-07-15 DIAGNOSIS — I10 Essential (primary) hypertension: Secondary | ICD-10-CM

## 2022-03-09 ENCOUNTER — Other Ambulatory Visit: Payer: Self-pay | Admitting: Emergency Medicine

## 2022-03-09 DIAGNOSIS — E785 Hyperlipidemia, unspecified: Secondary | ICD-10-CM

## 2022-04-06 ENCOUNTER — Other Ambulatory Visit: Payer: Self-pay | Admitting: Emergency Medicine

## 2022-04-06 DIAGNOSIS — E785 Hyperlipidemia, unspecified: Secondary | ICD-10-CM

## 2022-05-05 ENCOUNTER — Other Ambulatory Visit: Payer: Self-pay | Admitting: Emergency Medicine

## 2022-05-05 DIAGNOSIS — E785 Hyperlipidemia, unspecified: Secondary | ICD-10-CM

## 2022-06-02 ENCOUNTER — Other Ambulatory Visit: Payer: Self-pay | Admitting: Emergency Medicine

## 2022-06-02 DIAGNOSIS — E785 Hyperlipidemia, unspecified: Secondary | ICD-10-CM

## 2022-06-19 ENCOUNTER — Other Ambulatory Visit: Payer: Self-pay | Admitting: Emergency Medicine

## 2022-06-30 ENCOUNTER — Other Ambulatory Visit: Payer: Self-pay | Admitting: Emergency Medicine

## 2022-06-30 DIAGNOSIS — I10 Essential (primary) hypertension: Secondary | ICD-10-CM

## 2022-08-21 ENCOUNTER — Encounter: Payer: Self-pay | Admitting: Gastroenterology

## 2022-09-22 ENCOUNTER — Other Ambulatory Visit: Payer: Self-pay | Admitting: Emergency Medicine

## 2022-09-22 DIAGNOSIS — I10 Essential (primary) hypertension: Secondary | ICD-10-CM

## 2022-11-19 ENCOUNTER — Other Ambulatory Visit: Payer: Self-pay | Admitting: Emergency Medicine

## 2022-11-19 DIAGNOSIS — E785 Hyperlipidemia, unspecified: Secondary | ICD-10-CM

## 2022-12-16 ENCOUNTER — Other Ambulatory Visit: Payer: Self-pay | Admitting: Emergency Medicine

## 2022-12-16 DIAGNOSIS — I10 Essential (primary) hypertension: Secondary | ICD-10-CM

## 2023-05-11 ENCOUNTER — Other Ambulatory Visit: Payer: Self-pay | Admitting: Emergency Medicine

## 2023-05-11 DIAGNOSIS — E785 Hyperlipidemia, unspecified: Secondary | ICD-10-CM

## 2023-09-07 ENCOUNTER — Other Ambulatory Visit: Payer: Self-pay | Admitting: Emergency Medicine

## 2023-09-07 DIAGNOSIS — I10 Essential (primary) hypertension: Secondary | ICD-10-CM

## 2023-10-26 ENCOUNTER — Other Ambulatory Visit: Payer: Self-pay | Admitting: Emergency Medicine

## 2023-10-26 DIAGNOSIS — E785 Hyperlipidemia, unspecified: Secondary | ICD-10-CM

## 2023-11-21 ENCOUNTER — Ambulatory Visit: Payer: Self-pay | Admitting: Emergency Medicine

## 2023-11-21 NOTE — Telephone Encounter (Signed)
Message from [place] J sent at 11/21/2023  1:11 PM EST  Summary: Rash   Copied From CRM [id number]. Reason for Triage: Patient has had rash on both legs for 2 to 3 weeks. Began as small bumps, but now has scabbing due to scratching. Rash is pruritic, but triamcinolone has been beneficial in treating this. No drainage or swelling. Only has pain when touching the rash and messing with it. No fever or upper respiratory symptoms. CB# [phone]         Chief Complaint: rash Symptoms: rash/ redness on bilateral legs and possibly back, pain , also rash in private area Frequency: 1-2 weeks Pertinent Negatives: Patient denies n/v SOB Disposition: [] ED /[] Urgent Care (no appt availability in office) / [x] Appointment(In office/virtual)/ []  [facility]/ [] Home Care/ [] Refused Recommended Disposition /[] [place] [facility]/ []  Follow-up with PCP Additional Notes: pt states been scratching so much now its painful  Reason for Disposition  [1] Localized rash is very painful AND [2] no fever  Answer Assessment - Initial Assessment Questions 1. APPEARANCE of RASH: "Describe the rash."  rash redness producing itching and pain 10/10 due to itching 2. LOCATION: "Where is the rash located?"      Bilateral legs and possibly back, rash in private area 3. NUMBER: "How many spots are there?"      Too many to count  4. SIZE: "How big are the spots?" (Inches, centimeters or compare to size of a coin)      large 5. ONSET: "When did the rash start?"      1 to 2 weeks 6. ITCHING: "Does the rash itch?" If Yes, ask: "How bad is the itch?"  (Scale 0-10; or none, mild, moderate, severe)     10/10 7. PAIN: "Does the rash hurt?" If Yes, ask: "How bad is the pain?"  (Scale 0-10; or none, mild, moderate, severe)    - NONE (0): no pain    - MILD (1-3): doesn't interfere with normal activities     - MODERATE (4-7): interferes with normal activities or awakens from sleep     - SEVERE (8-10): excruciating  pain, unable to do any normal activities     10/10 8. OTHER SYMPTOMS: "Do you have any other symptoms?" (e.g., fever)     N/a 9. PREGNANCY: "Is there any chance you are pregnant?" "When was your last menstrual period?"     N/a  Protocols used: Rash or Redness - Localized-A-AH

## 2023-11-22 ENCOUNTER — Ambulatory Visit: Payer: BC Managed Care – PPO | Admitting: Family Medicine

## 2023-11-22 ENCOUNTER — Encounter: Payer: Self-pay | Admitting: Family Medicine

## 2023-11-22 VITALS — BP 142/78 | HR 75 | Temp 98.3°F | Ht 65.0 in | Wt 172.2 lb

## 2023-11-22 DIAGNOSIS — R21 Rash and other nonspecific skin eruption: Secondary | ICD-10-CM | POA: Diagnosis not present

## 2023-11-22 DIAGNOSIS — J302 Other seasonal allergic rhinitis: Secondary | ICD-10-CM | POA: Diagnosis not present

## 2023-11-22 DIAGNOSIS — R062 Wheezing: Secondary | ICD-10-CM | POA: Diagnosis not present

## 2023-11-22 DIAGNOSIS — B372 Candidiasis of skin and nail: Secondary | ICD-10-CM

## 2023-11-22 MED ORDER — FLUCONAZOLE 150 MG PO TABS: 150.0000 mg | ORAL_TABLET | Freq: Once | ORAL | 0 refills | Status: AC

## 2023-11-22 MED ORDER — FLUTICASONE PROPIONATE 50 MCG/ACT NA SUSP: 2.0000 | Freq: Every day | NASAL | 1 refills | Status: AC

## 2023-11-22 MED ORDER — ALBUTEROL SULFATE HFA 108 (90 BASE) MCG/ACT IN AERS: 2.0000 | INHALATION_SPRAY | RESPIRATORY_TRACT | 5 refills | Status: AC | PRN

## 2023-11-22 MED ORDER — PREDNISONE 20 MG PO TABS: 40.0000 mg | ORAL_TABLET | Freq: Every day | ORAL | 0 refills | Status: AC

## 2023-11-22 NOTE — Patient Instructions (Signed)
I have sent in prednisone for you to take 2 tablets once daily in the morning with breakfast for the next 5 days.  I have also sent in Diflucan for you to take in case of yeast after antibiotic treatment.  If you are having symptoms when you complete antibiotics, may take 1 tablet.  If you are still having symptoms 3 days later, may take the second tablet.  Follow-up with me for new or worsening symptoms.

## 2023-11-22 NOTE — Progress Notes (Signed)
Acute Office Visit  Subjective:     Patient ID: Jacqueline Thompson, female    DOB: December 12, 1955, 68 y.o.   MRN: [id number]  Chief Complaint  Patient presents with   Rash    Bilateral leg/foot/ankle rash for the past several weeks (notes of pain when touching it, no constant pain or itching). Possible breakout mid/lower back. Notes new sense of itching in groin area but can not confirm if this is the same type of rash that is on legs. Currently treating with triamcinolone.    HPI 68 year old female presents for evaluation of rash today. Reports erythematous, itchy rash to bilateral lower legs. Also reports red itchy rash to vagina. States that these been going on for about the last 2 to 3 weeks. Has been using triamcinolone to the rash on her legs with relief of the itching, but the rash is still there. No new exposures. Reports she has had this in the past when she overdoes citrus.  States she is also been doing this again. Denies any discharge from the areas, known sick contacts, other concerns today. Requesting refills of albuterol and Flonase as well.   ROS Per HPI      Objective:    BP (!) 142/78   Pulse 75   Temp 98.3 F (36.8 C)   Ht 5\' 5"  (1.651 m)   Wt 172 lb 3.2 oz (78.1 kg)   SpO2 95%   BMI 28.66 kg/m    Physical Exam Vitals and nursing note reviewed.  Constitutional:      Appearance: Normal appearance. She is normal weight.  HENT:     Head: Normocephalic and atraumatic.     Right Ear: Tympanic membrane and ear canal normal.     Left Ear: Tympanic membrane and ear canal normal.     Nose: Nose normal.  Eyes:     Extraocular Movements: Extraocular movements intact.     Pupils: Pupils are equal, round, and reactive to light.  Cardiovascular:     Rate and Rhythm: Normal rate and regular rhythm.     Heart sounds: Normal heart sounds.  Pulmonary:     Effort: Pulmonary effort is normal.     Breath sounds: Normal breath sounds.  Musculoskeletal:         General: Normal range of motion.     Cervical back: Normal range of motion.  Skin:    Findings: Lesion and rash present.          Comments: Erythematou   Neurological:     General: No focal deficit present.     Mental Status: She is alert and oriented to person, place, and time.  Psychiatric:        Mood and Affect: Mood normal.        Thought Content: Thought content normal.     No results found for any visits on 11/22/23.      Assessment & Plan:  1. Rash (Primary)  - predniSONE (DELTASONE) 20 MG tablet; Take 2 tablets (40 mg total) by mouth daily for 5 days.  Dispense: 10 tablet; Refill: 0  2. Wheezing  - albuterol (VENTOLIN HFA) 108 (90 Base) MCG/ACT inhaler; Inhale 2 puffs into the lungs every 4 (four) hours as needed for wheezing or shortness of breath (cough, shortness of breath or wheezing.).  Dispense: 18 g; Refill: 5  3. Seasonal allergies  - fluticasone (FLONASE) 50 MCG/ACT nasal spray; Place 2 sprays into both nostrils daily.  Dispense: 48 g; Refill: 1  4. Yeast dermatitis  - fluconazole (DIFLUCAN) 150 MG tablet; Take 1 tablet (150 mg total) by mouth once for 1 dose.  Dispense: 1 tablet; Refill: 0   Meds ordered this encounter  Medications   albuterol (VENTOLIN HFA) 108 (90 Base) MCG/ACT inhaler    Sig: Inhale 2 puffs into the lungs every 4 (four) hours as needed for wheezing or shortness of breath (cough, shortness of breath or wheezing.).    Dispense:  18 g    Refill:  5   fluticasone (FLONASE) 50 MCG/ACT nasal spray    Sig: Place 2 sprays into both nostrils daily.    Dispense:  48 g    Refill:  1   fluconazole (DIFLUCAN) 150 MG tablet    Sig: Take 1 tablet (150 mg total) by mouth once for 1 dose.    Dispense:  1 tablet    Refill:  0   predniSONE (DELTASONE) 20 MG tablet    Sig: Take 2 tablets (40 mg total) by mouth daily for 5 days.    Dispense:  10 tablet    Refill:  0    Return for Physical with PCP.  Moshe Cipro, FNP

## 2023-11-27 ENCOUNTER — Ambulatory Visit: Payer: BC Managed Care – PPO | Admitting: Emergency Medicine

## 2023-11-27 ENCOUNTER — Encounter: Payer: Self-pay | Admitting: Emergency Medicine

## 2023-11-27 VITALS — BP 144/88 | Ht 65.0 in

## 2023-11-27 DIAGNOSIS — E785 Hyperlipidemia, unspecified: Secondary | ICD-10-CM | POA: Diagnosis not present

## 2023-11-27 DIAGNOSIS — I10 Essential (primary) hypertension: Secondary | ICD-10-CM

## 2023-11-27 DIAGNOSIS — Z1231 Encounter for screening mammogram for malignant neoplasm of breast: Secondary | ICD-10-CM | POA: Diagnosis not present

## 2023-11-27 DIAGNOSIS — Z13228 Encounter for screening for other metabolic disorders: Secondary | ICD-10-CM | POA: Diagnosis not present

## 2023-11-27 DIAGNOSIS — N1832 Chronic kidney disease, stage 3b: Secondary | ICD-10-CM | POA: Diagnosis not present

## 2023-11-27 DIAGNOSIS — Z0001 Encounter for general adult medical examination with abnormal findings: Secondary | ICD-10-CM

## 2023-11-27 DIAGNOSIS — I700 Atherosclerosis of aorta: Secondary | ICD-10-CM

## 2023-11-27 DIAGNOSIS — Z130 Encounter for screening for diseases of the blood and blood-forming organs and certain disorders involving the immune mechanism: Secondary | ICD-10-CM

## 2023-11-27 DIAGNOSIS — Z1329 Encounter for screening for other suspected endocrine disorder: Secondary | ICD-10-CM

## 2023-11-27 LAB — COMPREHENSIVE METABOLIC PANEL
ALT: 31 U/L (ref 0–35)
AST: 26 U/L (ref 0–37)
Albumin: 4.7 g/dL (ref 3.5–5.2)
Alkaline Phosphatase: 64 U/L (ref 39–117)
BUN: 20 mg/dL (ref 6–23)
CO2: 32 meq/L (ref 19–32)
Calcium: 9.9 mg/dL (ref 8.4–10.5)
Chloride: 101 meq/L (ref 96–112)
Creatinine, Ser: 1.05 mg/dL (ref 0.40–1.20)
GFR: 54.05 mL/min — ABNORMAL LOW (ref >60.00)
Glucose, Bld: 95 mg/dL (ref 70–99)
Potassium: 3.2 meq/L — ABNORMAL LOW (ref 3.5–5.1)
Sodium: 141 meq/L (ref 135–145)
Total Bilirubin: 0.5 mg/dL (ref 0.2–1.2)
Total Protein: 8.4 g/dL — ABNORMAL HIGH (ref 6.0–8.3)

## 2023-11-27 LAB — CBC WITH DIFFERENTIAL/PLATELET
Basophils Absolute: 0.1 10*3/uL (ref 0.0–0.1)
Basophils Relative: 1 % (ref 0.0–3.0)
Eosinophils Absolute: 0.1 10*3/uL (ref 0.0–0.7)
Eosinophils Relative: 1.1 % (ref 0.0–5.0)
HCT: 52.9 % — ABNORMAL HIGH (ref 36.0–46.0)
Hemoglobin: 17.6 g/dL — ABNORMAL HIGH (ref 12.0–15.0)
Lymphocytes Relative: 63.7 % — ABNORMAL HIGH (ref 12.0–46.0)
Lymphs Abs: 6 10*3/uL — ABNORMAL HIGH (ref 0.7–4.0)
MCHC: 33.3 g/dL (ref 30.0–36.0)
MCV: 92.2 fL (ref 78.0–100.0)
Monocytes Absolute: 0.6 10*3/uL (ref 0.1–1.0)
Monocytes Relative: 6.6 % (ref 3.0–12.0)
Neutro Abs: 2.6 10*3/uL (ref 1.4–7.7)
Neutrophils Relative %: 27.6 % — ABNORMAL LOW (ref 43.0–77.0)
Platelets: 276 10*3/uL (ref 150.0–400.0)
RBC: 5.74 Mil/uL — ABNORMAL HIGH (ref 3.87–5.11)
RDW: 13.9 % (ref 11.5–15.5)
WBC: 9.5 10*3/uL (ref 4.0–10.5)

## 2023-11-27 LAB — LIPID PANEL
Cholesterol: 183 mg/dL (ref 0–200)
HDL: 71.1 mg/dL (ref >39.00)
LDL Cholesterol: 93 mg/dL (ref 0–99)
NonHDL: 111.64
Total CHOL/HDL Ratio: 3
Triglycerides: 95 mg/dL (ref 0.0–149.0)
VLDL: 19 mg/dL (ref 0.0–40.0)

## 2023-11-27 LAB — HEMOGLOBIN A1C: Hgb A1c MFr Bld: 5.9 % (ref 4.6–6.5)

## 2023-11-27 NOTE — Assessment & Plan Note (Signed)
Advised to stay well-hydrated and avoid NSAIDs. Blood work done today.

## 2023-11-27 NOTE — Progress Notes (Signed)
Jacqueline Thompson 68 y.o.   Chief Complaint  Patient presents with  . Annual Exam    Patient c/o of both of her legs has rash/bump on both legs she wants looked at. She did see NP Ashley Royalty for this and was given abx and a cream and is slowly getting better    HISTORY OF PRESENT ILLNESS: This is a 68 y.o. female here for annual exam History of hypertension Recent rash successfully treated at the urgent care center No other complaints or medical concerns today.  HPI   Prior to Admission medications   Medication Sig Start Date End Date Taking? Authorizing Provider  albuterol (VENTOLIN HFA) 108 (90 Base) MCG/ACT inhaler Inhale 2 puffs into the lungs every 4 (four) hours as needed for wheezing or shortness of breath (cough, shortness of breath or wheezing.). 11/22/23  Yes Moshe Cipro, FNP  amLODipine (NORVASC) 10 MG tablet TAKE 1 TABLET BY MOUTH EVERY DAY 09/07/23  Yes Azoria Abbett, Eilleen Kempf, MD  fluticasone [facility]) 50 MCG/ACT nasal spray Place 2 sprays into both nostrils daily. 11/22/23  Yes Moshe Cipro, FNP  losartan-hydrochlorothiazide (HYZAAR) 100-12.5 MG tablet TAKE 1 TABLET BY MOUTH EVERY DAY 09/07/23  Yes Georgina Quint, MD  rosuvastatin (CRESTOR) 10 MG tablet TAKE 1 TABLET BY MOUTH EVERY DAY 10/26/23 01/24/24 Yes Jakalyn Kratky, Eilleen Kempf, MD  predniSONE (DELTASONE) 20 MG tablet Take 2 tablets (40 mg total) by mouth daily for 5 days. Patient not taking: Reported on 11/27/2023 11/22/23 11/27/23  Moshe Cipro, FNP    No Known Allergies  Patient Active Problem List   Diagnosis Date Noted  . Stage 3b chronic kidney disease (HCC) 11/27/2023  . Dyslipidemia 02/20/2021  . Atherosclerosis of aorta (HCC) 01/08/2019  . Elevated LDL cholesterol level 05/20/2018  . Essential hypertension 02/22/2015    Past Medical History:  Diagnosis Date  . Allergy   . Hypertension     Past Surgical History:  Procedure Laterality Date  . ABDOMINAL HYSTERECTOMY  2007   . Birth Mark Removed from Face Left     Social History   Socioeconomic History  . Marital status: Legally Separated    Spouse name: Not on file  . Number of children: 0  . Years of education: Not on file  . Highest education level: Master's degree (e.g., MA, MS, MEng, MEd, MSW, MBA)  Occupational History  . Occupation: distrubution specialist  Tobacco Use  . Smoking status: Former    Current packs/day: 0.00    Average packs/day: 0.8 packs/day for 10.1 years (7.6 ttl pk-yrs)    Types: Cigarettes    Start date: 6    Quit date: 11/29/1997    Years since quitting: 26.0  . Smokeless tobacco: Never  . Tobacco comments:    smoked a pack per week  Vaping Use  . Vaping status: Never Used  Substance and Sexual Activity  . Alcohol use: Yes    Alcohol/week: 4.0 standard drinks of alcohol    Types: 3 Glasses of wine, 1 Shots of liquor per week    Comment: 2 drinks a week  . Drug use: No  . Sexual activity: Yes    Partners: Male    Birth control/protection: None  Other Topics Concern  . Not on file  Social History Narrative   Marital status: married   Children: none   Lives with: husband           Seatbelt: 100%   Guns in home: no  Exercise - active at work   Sleep - good   Social Drivers of Health   Financial Resource Strain: Low Risk  (11/23/2023)   Overall Financial Resource Strain (CARDIA)   . Difficulty of Paying Living Expenses: Not very hard  Food Insecurity: No Food Insecurity (11/23/2023)   Hunger Vital Sign   . Worried About Programme researcher, broadcasting/film/video in the Last Year: Never true   . Ran Out of Food in the Last Year: Never true  Transportation Needs: No Transportation Needs (11/23/2023)   PRAPARE - Transportation   . Lack of Transportation (Medical): No   . Lack of Transportation (Non-Medical): No  Physical Activity: Sufficiently Active (11/23/2023)   Exercise Vital Sign   . Days of Exercise per Week: 7 days   . Minutes of Exercise per Session: 30 min   Stress: No Stress Concern Present (11/23/2023)   [employer] of Occupational Health - Occupational Stress Questionnaire   . Feeling of Stress : Only a little  Social Connections: Moderately Isolated (11/23/2023)   Social Connection and Isolation Panel [NHANES]   . Frequency of Communication with Friends and Family: Three times a week   . Frequency of Social Gatherings with Friends and Family: Three times a week   . Attends Religious Services: Never   . Active Member of Clubs or Organizations: No   . Attends Banker Meetings: Not on file   . Marital Status: Married  Catering manager Violence: Not on file    Family History  Problem Relation Age of Onset  . Heart disease Mother   . Cancer Father        colon,prostate and spine  . Stroke Brother   . Colon cancer Neg Hx   . Esophageal cancer Neg Hx   . Pancreatic cancer Neg Hx   . Rectal cancer Neg Hx   . Stomach cancer Neg Hx      Review of Systems  Constitutional: Negative.  Negative for chills and fever.  HENT: Negative.  Negative for congestion and sore throat.   Respiratory: Negative.  Negative for cough and shortness of breath.   Cardiovascular: Negative.  Negative for chest pain and palpitations.  Gastrointestinal:  Negative for abdominal pain, diarrhea, nausea and vomiting.  Genitourinary: Negative.  Negative for dysuria and hematuria.  Skin: Negative.  Negative for rash.  Neurological: Negative.  Negative for dizziness and headaches.  All other systems reviewed and are negative.   Today's Vitals   11/27/23 1439  BP: (!) 144/88  Height: 5\' 5"  (1.651 m)   Body mass index is 28.66 kg/m.   Physical Exam Vitals reviewed.  Constitutional:      Appearance: Normal appearance.  HENT:     Head: Normocephalic.     Right Ear: Tympanic membrane, ear canal and external ear normal.     Left Ear: Tympanic membrane, ear canal and external ear normal.     Mouth/Throat:     Mouth: Mucous membranes are  moist.     Pharynx: Oropharynx is clear.  Eyes:     Extraocular Movements: Extraocular movements intact.     Conjunctiva/sclera: Conjunctivae normal.     Pupils: Pupils are equal, round, and reactive to light.  Cardiovascular:     Rate and Rhythm: Normal rate and regular rhythm.     Pulses: Normal pulses.     Heart sounds: Normal heart sounds.  Pulmonary:     Effort: Pulmonary effort is normal.     Breath sounds: Normal breath  sounds.  Abdominal:     Palpations: Abdomen is soft.     Tenderness: There is no abdominal tenderness.  Musculoskeletal:     Cervical back: No tenderness.  Lymphadenopathy:     Cervical: No cervical adenopathy.  Skin:    General: Skin is warm and dry.     Capillary Refill: Capillary refill takes less than 2 seconds.     Comments: Lower extremities: Flat small hyperpigmented round lesions with benign appearance  Neurological:     General: No focal deficit present.     Mental Status: She is alert and oriented to person, place, and time.  Psychiatric:        Mood and Affect: Mood normal.        Behavior: Behavior normal.     ASSESSMENT & PLAN: Problem List Items Addressed This Visit       Cardiovascular and Mediastinum   Essential hypertension   Elevated blood pressure reading in the office today.  States she did not take her amlodipine today Cardiovascular risks associated with uncontrolled hypertension discussed Dietary approaches to stop hypertension discussed Benefits of exercise discussed Continue amlodipine 10 mg daily and Hyzaar 100-12.5 mg daily Blood work done today Follow-up in 6 months      Relevant Orders   Comprehensive metabolic panel   Atherosclerosis of aorta (HCC)   Diet and nutrition discussed Benefits of exercise discussed Lipid profile done today Continue rosuvastatin 10 mg daily       Relevant Orders   Lipid panel     Genitourinary   Stage 3b chronic kidney disease (HCC)   Advised to stay well-hydrated and avoid  NSAIDs Blood work done today        Other   Dyslipidemia   Relevant Orders   Comprehensive metabolic panel   Hemoglobin A1c   Lipid panel   Other Visit Diagnoses       Encounter for general adult medical examination with abnormal findings    -  Primary   Relevant Orders   CBC with Differential/Platelet   Comprehensive metabolic panel   Hemoglobin A1c   Lipid panel     Screening for deficiency anemia       Relevant Orders   CBC with Differential/Platelet     Screening for endocrine, metabolic and immunity disorder       Relevant Orders   Comprehensive metabolic panel   Hemoglobin A1c     Screening mammogram for breast cancer       Relevant Orders   MM Digital Screening        Modifiable risk factors discussed with patient. Anticipatory guidance according to age provided. The following topics were also discussed: Social Determinants of Health Smoking.  Non-smoker Diet and nutrition Benefits of exercise Cancer screening and need for breast cancer screening with mammogram Vaccinations review and recommendations Cardiovascular risk assessment and need for blood work Diagnosis of hypertension and cardiovascular risks associated with this condition Review of all medications Mental health including depression and anxiety Fall and accident prevention  Patient Instructions  Health Maintenance, Female Adopting a healthy lifestyle and getting preventive care are important in promoting health and wellness. Ask your health care provider about: The right schedule for you to have regular tests and exams. Things you can do on your own to prevent diseases and keep yourself healthy. What should I know about diet, weight, and exercise? Eat a healthy diet  Eat a diet that includes plenty of vegetables, fruits, low-fat dairy products, and lean protein.  Do not eat a lot of foods that are high in solid fats, added sugars, or sodium. Maintain a healthy weight Body mass index (BMI)  is used to identify weight problems. It estimates body fat based on height and weight. Your health care provider can help determine your BMI and help you achieve or maintain a healthy weight. Get regular exercise Get regular exercise. This is one of the most important things you can do for your health. Most adults should: Exercise for at least 150 minutes each week. The exercise should increase your heart rate and make you sweat (moderate-intensity exercise). Do strengthening exercises at least twice a week. This is in addition to the moderate-intensity exercise. Spend less time sitting. Even light physical activity can be beneficial. Watch cholesterol and blood lipids Have your blood tested for lipids and cholesterol at 70 years of age, then have this test every 5 years. Have your cholesterol levels checked more often if: Your lipid or cholesterol levels are high. You are older than 70 years of age. You are at high risk for heart disease. What should I know about cancer screening? Depending on your health history and family history, you may need to have cancer screening at various ages. This may include screening for: Breast cancer. Cervical cancer. Colorectal cancer. Skin cancer. Lung cancer. What should I know about heart disease, diabetes, and high blood pressure? Blood pressure and heart disease High blood pressure causes heart disease and increases the risk of stroke. This is more likely to develop in people who have high blood pressure readings or are overweight. Have your blood pressure checked: Every 3-5 years if you are 58-71 years of age. Every year if you are 84 years old or older. Diabetes Have regular diabetes screenings. This checks your fasting blood sugar level. Have the screening done: Once every three years after age 75 if you are at a normal weight and have a low risk for diabetes. More often and at a younger age if you are overweight or have a high risk for  diabetes. What should I know about preventing infection? Hepatitis B If you have a higher risk for hepatitis B, you should be screened for this virus. Talk with your health care provider to find out if you are at risk for hepatitis B infection. Hepatitis C Testing is recommended for: Everyone born from [birth year] through 1967. Anyone with known risk factors for hepatitis C. Sexually transmitted infections (STIs) Get screened for STIs, including gonorrhea and chlamydia, if: You are sexually active and are younger than 70 years of age. You are older than 70 years of age and your health care provider tells you that you are at risk for this type of infection. Your sexual activity has changed since you were last screened, and you are at increased risk for chlamydia or gonorrhea. Ask your health care provider if you are at risk. Ask your health care provider about whether you are at high risk for HIV. Your health care provider may recommend a prescription medicine to help prevent HIV infection. If you choose to take medicine to prevent HIV, you should first get tested for HIV. You should then be tested every 3 months for as long as you are taking the medicine. Pregnancy If you are about to stop having your period (premenopausal) and you may become pregnant, seek counseling before you get pregnant. Take 400 to 800 micrograms (mcg) of folic acid every day if you become pregnant. Ask for birth control (contraception) if  you want to prevent pregnancy. Osteoporosis and menopause Osteoporosis is a disease in which the bones lose minerals and strength with aging. This can result in bone fractures. If you are 70 years old or older, or if you are at risk for osteoporosis and fractures, ask your health care provider if you should: Be screened for bone loss. Take a calcium or vitamin D supplement to lower your risk of fractures. Be given hormone replacement therapy (HRT) to treat symptoms of menopause. Follow these  instructions at home: Alcohol use Do not drink alcohol if: Your health care provider tells you not to drink. You are pregnant, may be pregnant, or are planning to become pregnant. If you drink alcohol: Limit how much you have to: 0-1 drink a day. Know how much alcohol is in your drink. In the [place] one drink equals one 12 oz bottle of beer (355 mL), one 5 oz glass of wine (148 mL), or one 1 oz glass of hard liquor (44 mL). Lifestyle Do not use any products that contain nicotine or tobacco. These products include cigarettes, chewing tobacco, and vaping devices, such as e-cigarettes. If you need help quitting, ask your health care provider. Do not use street drugs. Do not share needles. Ask your health care provider for help if you need support or information about quitting drugs. General instructions Schedule regular health, dental, and eye exams. Stay current with your vaccines. Tell your health care provider if: You often feel depressed. You have ever been abused or do not feel safe at home. Summary Adopting a healthy lifestyle and getting preventive care are important in promoting health and wellness. Follow your health care provider's instructions about healthy diet, exercising, and getting tested or screened for diseases. Follow your health care provider's instructions on monitoring your cholesterol and blood pressure. This information is not intended to replace advice given to you by your health care provider. Make sure you discuss any questions you have with your health care provider. Document Revised: 03/06/2021 Document Reviewed: 03/06/2021 Elsevier Patient Education  2024 Elsevier Inc.      Edwina Barth, MD [facility]

## 2023-11-27 NOTE — Assessment & Plan Note (Signed)
Diet and nutrition discussed Benefits of exercise discussed Lipid profile done today Continue rosuvastatin 10 mg daily

## 2023-11-27 NOTE — Assessment & Plan Note (Signed)
Elevated blood pressure reading in the office today.  States she did not take her amlodipine today Cardiovascular risks associated with uncontrolled hypertension discussed Dietary approaches to stop hypertension discussed Benefits of exercise discussed Continue amlodipine 10 mg daily and Hyzaar 100-12.5 mg daily Blood work done today Follow-up in 6 months

## 2023-11-27 NOTE — Patient Instructions (Signed)

## 2023-11-28 ENCOUNTER — Encounter: Payer: Self-pay | Admitting: Emergency Medicine

## 2023-12-16 ENCOUNTER — Ambulatory Visit
Admission: RE | Admit: 2023-12-16 | Discharge: 2023-12-16 | Disposition: A | Discharge status: Home / Self Care | Payer: BC Managed Care – PPO | Source: Ambulatory Visit | Attending: Emergency Medicine

## 2023-12-16 DIAGNOSIS — Z1231 Encounter for screening mammogram for malignant neoplasm of breast: Secondary | ICD-10-CM

## 2023-12-19 ENCOUNTER — Encounter: Payer: Self-pay | Admitting: Emergency Medicine

## 2024-01-16 ENCOUNTER — Other Ambulatory Visit: Payer: Self-pay | Admitting: Emergency Medicine

## 2024-01-16 DIAGNOSIS — E785 Hyperlipidemia, unspecified: Secondary | ICD-10-CM

## 2024-01-27 ENCOUNTER — Ambulatory Visit (INDEPENDENT_AMBULATORY_CARE_PROVIDER_SITE_OTHER)

## 2024-01-27 DIAGNOSIS — Z111 Encounter for screening for respiratory tuberculosis: Secondary | ICD-10-CM

## 2024-01-28 ENCOUNTER — Ambulatory Visit: Admitting: Emergency Medicine

## 2024-01-28 ENCOUNTER — Encounter: Payer: Self-pay | Admitting: Emergency Medicine

## 2024-01-28 VITALS — BP 164/90 | HR 84 | Temp 98.4°F | Ht 65.0 in | Wt 170.0 lb

## 2024-01-28 DIAGNOSIS — L089 Local infection of the skin and subcutaneous tissue, unspecified: Secondary | ICD-10-CM | POA: Diagnosis not present

## 2024-01-28 DIAGNOSIS — L239 Allergic contact dermatitis, unspecified cause: Secondary | ICD-10-CM | POA: Diagnosis not present

## 2024-01-28 MED ORDER — PREDNISONE 20 MG PO TABS: 40.0000 mg | ORAL_TABLET | Freq: Every day | ORAL | 0 refills | Status: AC

## 2024-01-28 MED ORDER — CEFADROXIL 500 MG PO CAPS: 500.0000 mg | ORAL_CAPSULE | Freq: Two times a day (BID) | ORAL | 0 refills | Status: AC

## 2024-01-28 MED ORDER — FLUCONAZOLE 150 MG PO TABS
150.0000 mg | ORAL_TABLET | Freq: Once | ORAL | 0 refills | Status: AC
Start: 2024-01-28 — End: 2024-01-28

## 2024-01-28 NOTE — Assessment & Plan Note (Signed)
 Unknown trigger.  Clinically stable. Recommend to start prednisone 40 mg daily for 5 days Recommend dermatology evaluation Has secondary bacterial infection

## 2024-01-28 NOTE — Assessment & Plan Note (Signed)
 Secondary bacterial infection from itching Recommend to start Duricef 500 mg twice a day for 7 days Dermatology referral placed today

## 2024-01-28 NOTE — Patient Instructions (Signed)
 Contact Dermatitis Dermatitis is when your skin becomes red, sore, and swollen.  Contact dermatitis happens when your body reacts to something that touches the skin. There are 2 types: Irritant contact dermatitis. This is when something bothers your skin, like soap. Allergic contact dermatitis. This is when your skin touches something you are allergic to, like poison ivy. What are the causes? Irritant contact dermatitis may be caused by: Makeup. Soaps. Detergents. Bleaches. Acids. Metals, like nickel. Allergic contact dermatitis may be caused by: Plants. Chemicals. Jewelry. Latex. Medicines. Preservatives. These are things added to products to help them last longer. There may be some in your clothes. What increases the risk? Having a job where you have to be near things that bother your skin. Having asthma or eczema. What are the signs or symptoms?  Dry or flaky skin. Redness. Cracks. Itching. Moderate symptoms of this condition include: Pain or a burning feeling. Blisters. Blood or clear fluid coming from cracks in your skin. Swelling. This may be on your eyelids, mouth, or genitals. How is this treated? Your doctor will find out what is making your skin react. Then, you can protect your skin. You may need to use: Steroid creams, ointments, or medicines. Antibiotics or other ointments, if you have a skin infection. Lotion or medicines to help with itching. A bandage. Follow these instructions at home: Skin care Put moisturizer on your skin when it needs it. Put cool, wet cloths on your skin (cool compresses). Put a baking soda paste on your skin. Stir water into baking soda until it looks like a paste. Do not scratch your skin. Try not to have things rub up against your skin. Avoid tight clothing. Avoid using soaps, perfumes, and dyes. Check your skin every day for signs of infection. Check for: More redness, swelling, or pain. More fluid or blood. Warmth. Pus or  a bad smell. Medicines Take or apply over-the-counter and prescription medicines only as told by your doctor. If you were prescribed antibiotics, take or apply them as told by your doctor. Do not stop using them even if you start to feel better. Bathing Take a bath with: Epsom salts. Baking soda. Colloidal oatmeal. Bathe less often. Bathe in warm water. Try not to use hot water. Bandage care If you were given a bandage, change it as told by your doctor. Wash your hands with soap and water for at least 20 seconds before and after you change your bandage. If you cannot use soap and water, use hand sanitizer. General instructions Avoid the things that caused your reaction. If you don't know what caused it, keep a journal. Write down: What you eat. What skin products you use. What you drink. What you wear. Contact a doctor if: You do not get better with treatment. You get worse. You have signs of infection. You have a fever. You have new symptoms. Your bone or joint near the area hurts after the skin has healed. Get help right away if: You see red streaks coming from the area. The area turns darker. You have trouble breathing. This information is not intended to replace advice given to you by your health care provider. Make sure you discuss any questions you have with your health care provider. Document Revised: 04/20/2022 Document Reviewed: 04/20/2022 Elsevier Patient Education  2024 [employer].

## 2024-01-28 NOTE — Progress Notes (Signed)
 Jacqueline Thompson 68 y.o.   Chief Complaint  Patient presents with   Rash    Patient here for rashes all over her body. Doesn't remember when it started. She mentions she had some bumps on both of her legs that seems to be getting worse and itchy. And also has bumps in her vaginal area that was looked at by [facility] and was told it was yeast. Seems to be getting worse also and spreading.    HISTORY OF PRESENT ILLNESS: This is a 68 y.o. female complaining of itchy rash for 1 to 2 weeks Itching to her legs leading to secondary infection Recently seen by gynecologist and diagnosed with yeast vaginitis.  Treated with Diflucan once Not certain about trigger but suspecting detergent sheets. No other complaint or medical concerns today.  Rash Pertinent negatives include no congestion, cough, diarrhea, fever, shortness of breath, sore throat or vomiting.     Prior to Admission medications   Medication Sig Start Date End Date Taking? Authorizing Provider  albuterol (VENTOLIN HFA) 108 (90 Base) MCG/ACT inhaler Inhale 2 puffs into the lungs every 4 (four) hours as needed for wheezing or shortness of breath (cough, shortness of breath or wheezing.). 11/22/23  Yes Sherald Barge, FNP  amLODipine (NORVASC) 10 MG tablet TAKE 1 TABLET BY MOUTH EVERY DAY 09/07/23  Yes Arlon Bleier, Eilleen Kempf, MD  fluticasone [facility]) 50 MCG/ACT nasal spray Place 2 sprays into both nostrils daily. 11/22/23  Yes Sherald Barge, FNP  losartan-hydrochlorothiazide (HYZAAR) 100-12.5 MG tablet TAKE 1 TABLET BY MOUTH EVERY DAY 09/07/23  Yes Georgina Quint, MD  rosuvastatin (CRESTOR) 10 MG tablet TAKE 1 TABLET BY MOUTH EVERY DAY 01/16/24 04/15/24 Yes Georgina Quint, MD    No Known Allergies  Patient Active Problem List   Diagnosis Date Noted   Stage 3b chronic kidney disease (HCC) 11/27/2023   Dyslipidemia 02/20/2021   Atherosclerosis of aorta (HCC) 01/08/2019   Elevated LDL cholesterol level  05/20/2018   Essential hypertension 02/22/2015    Past Medical History:  Diagnosis Date   Allergy    Hypertension     Past Surgical History:  Procedure Laterality Date   ABDOMINAL HYSTERECTOMY  2007   Birth Mark Removed from Face Left     Social History   Socioeconomic History   Marital status: Legally Separated    Spouse name: Not on file   Number of children: 0   Years of education: Not on file   Highest education level: Master's degree (e.g., MA, MS, MEng, MEd, MSW, MBA)  Occupational History   Occupation: distrubution specialist  Tobacco Use   Smoking status: Former    Current packs/day: 0.00    Average packs/day: 0.8 packs/day for 10.1 years (7.6 ttl pk-yrs)    Types: Cigarettes    Start date: 14    Quit date: 11/29/1997    Years since quitting: 26.1   Smokeless tobacco: Never   Tobacco comments:    smoked a pack per week  Vaping Use   Vaping status: Never Used  Substance and Sexual Activity   Alcohol use: Yes    Alcohol/week: 4.0 standard drinks of alcohol    Types: 3 Glasses of wine, 1 Shots of liquor per week    Comment: 2 drinks a week   Drug use: No   Sexual activity: Yes    Partners: Male    Birth control/protection: None  Other Topics Concern   Not on file  Social History Narrative   Marital  status: married   Children: none   Lives with: husband           Seatbelt: 100%   Guns in home: no         Exercise - active at work   Sleep - good   Social Drivers of Corporate investment banker Strain: Low Risk  (11/23/2023)   Overall Financial Resource Strain (CARDIA)    Difficulty of Paying Living Expenses: Not very hard  Food Insecurity: No Food Insecurity (11/23/2023)   Hunger Vital Sign    Worried About Running Out of Food in the Last Year: Never true    Ran Out of Food in the Last Year: Never true  Transportation Needs: No Transportation Needs (11/23/2023)   PRAPARE - Administrator, Civil Service (Medical): No    Lack of  Transportation (Non-Medical): No  Physical Activity: Sufficiently Active (11/23/2023)   Exercise Vital Sign    Days of Exercise per Week: 7 days    Minutes of Exercise per Session: 30 min  Stress: No Stress Concern Present (11/23/2023)   [employer] of Occupational Health - Occupational Stress Questionnaire    Feeling of Stress : Only a little  Social Connections: Moderately Isolated (11/23/2023)   Social Connection and Isolation Panel [NHANES]    Frequency of Communication with Friends and Family: Three times a week    Frequency of Social Gatherings with Friends and Family: Three times a week    Attends Religious Services: Never    Active Member of Clubs or Organizations: No    Attends Engineer, structural: Not on file    Marital Status: Married  Catering manager Violence: Not on file    Family History  Problem Relation Age of Onset   Heart disease Mother    Cancer Father        colon,prostate and spine   Stroke Brother    Colon cancer Neg Hx    Esophageal cancer Neg Hx    Pancreatic cancer Neg Hx    Rectal cancer Neg Hx    Stomach cancer Neg Hx    Breast cancer Neg Hx      Review of Systems  Constitutional: Negative.  Negative for chills and fever.  HENT: Negative.  Negative for congestion and sore throat.   Respiratory: Negative.  Negative for cough and shortness of breath.   Cardiovascular: Negative.  Negative for chest pain and palpitations.  Gastrointestinal:  Negative for abdominal pain, diarrhea, nausea and vomiting.  Genitourinary: Negative.  Negative for dysuria and hematuria.  Skin:  Positive for itching and rash.  Neurological:  Negative for dizziness and headaches.  All other systems reviewed and are negative.   Vitals:   01/28/24 1312  BP: (!) 164/90  Pulse: 84  Temp: 98.4 F (36.9 C)  SpO2: 95%    Physical Exam Constitutional:      Appearance: Normal appearance.  HENT:     Head: Normocephalic.     Mouth/Throat:     Mouth: Mucous  membranes are moist.     Pharynx: Oropharynx is clear.  Eyes:     Extraocular Movements: Extraocular movements intact.     Conjunctiva/sclera: Conjunctivae normal.     Pupils: Pupils are equal, round, and reactive to light.  Cardiovascular:     Rate and Rhythm: Normal rate and regular rhythm.     Pulses: Normal pulses.     Heart sounds: Normal heart sounds.  Pulmonary:     Effort: Pulmonary  effort is normal.     Breath sounds: Normal breath sounds.  Musculoskeletal:     Cervical back: No tenderness.     Right lower leg: No edema.     Left lower leg: No edema.  Lymphadenopathy:     Cervical: No cervical adenopathy.  Skin:    Findings: Rash present.     Comments: Diffuse maculopapular rash with areas of hives Secondary bacterial infection lower extremities  Neurological:     General: No focal deficit present.     Mental Status: She is alert and oriented to person, place, and time.  Psychiatric:        Mood and Affect: Mood normal.        Behavior: Behavior normal.      ASSESSMENT & PLAN: A total of 32 minutes was spent with the patient and counseling/coordination of care regarding preparing for this visit, review of most recent office visit notes, review of multiple chronic medical conditions and their management, review of all medications, diagnosis of skin infection and need for antibiotic and dermatology evaluation, review of most recent bloodwork results, review of health maintenance items, education on nutrition, prognosis, documentation, and need for follow up.   Problem List Items Addressed This Visit       Musculoskeletal and Integument   Allergic dermatitis - Primary   Unknown trigger.  Clinically stable. Recommend to start prednisone 40 mg daily for 5 days Recommend dermatology evaluation Has secondary bacterial infection      Relevant Medications   predniSONE (DELTASONE) 20 MG tablet   Other Relevant Orders   Ambulatory referral to Dermatology   Skin  infection   Secondary bacterial infection from itching Recommend to start Duricef 500 mg twice a day for 7 days Dermatology referral placed today      Relevant Medications   cefadroxil (DURICEF) 500 MG capsule   fluconazole (DIFLUCAN) 150 MG tablet   Other Relevant Orders   Ambulatory referral to Dermatology   Patient Instructions  Contact Dermatitis Dermatitis is when your skin becomes red, sore, and swollen.  Contact dermatitis happens when your body reacts to something that touches the skin. There are 2 types: Irritant contact dermatitis. This is when something bothers your skin, like soap. Allergic contact dermatitis. This is when your skin touches something you are allergic to, like poison ivy. What are the causes? Irritant contact dermatitis may be caused by: Makeup. Soaps. Detergents. Bleaches. Acids. Metals, like nickel. Allergic contact dermatitis may be caused by: Plants. Chemicals. Jewelry. Latex. Medicines. Preservatives. These are things added to products to help them last longer. There may be some in your clothes. What increases the risk? Having a job where you have to be near things that bother your skin. Having asthma or eczema. What are the signs or symptoms?  Dry or flaky skin. Redness. Cracks. Itching. Moderate symptoms of this condition include: Pain or a burning feeling. Blisters. Blood or clear fluid coming from cracks in your skin. Swelling. This may be on your eyelids, mouth, or genitals. How is this treated? Your doctor will find out what is making your skin react. Then, you can protect your skin. You may need to use: Steroid creams, ointments, or medicines. Antibiotics or other ointments, if you have a skin infection. Lotion or medicines to help with itching. A bandage. Follow these instructions at home: Skin care Put moisturizer on your skin when it needs it. Put cool, wet cloths on your skin (cool compresses). Put a baking soda  paste  on your skin. Stir water into baking soda until it looks like a paste. Do not scratch your skin. Try not to have things rub up against your skin. Avoid tight clothing. Avoid using soaps, perfumes, and dyes. Check your skin every day for signs of infection. Check for: More redness, swelling, or pain. More fluid or blood. Warmth. Pus or a bad smell. Medicines Take or apply over-the-counter and prescription medicines only as told by your doctor. If you were prescribed antibiotics, take or apply them as told by your doctor. Do not stop using them even if you start to feel better. Bathing Take a bath with: Epsom salts. Baking soda. Colloidal oatmeal. Bathe less often. Bathe in warm water. Try not to use hot water. Bandage care If you were given a bandage, change it as told by your doctor. Wash your hands with soap and water for at least 20 seconds before and after you change your bandage. If you cannot use soap and water, use hand sanitizer. General instructions Avoid the things that caused your reaction. If you don't know what caused it, keep a journal. Write down: What you eat. What skin products you use. What you drink. What you wear. Contact a doctor if: You do not get better with treatment. You get worse. You have signs of infection. You have a fever. You have new symptoms. Your bone or joint near the area hurts after the skin has healed. Get help right away if: You see red streaks coming from the area. The area turns darker. You have trouble breathing. This information is not intended to replace advice given to you by your health care provider. Make sure you discuss any questions you have with your health care provider. Document Revised: 04/20/2022 Document Reviewed: 04/20/2022 Elsevier Patient Education  2024 Elsevier Inc.    Edwina Barth, MD [facility]

## 2024-01-29 ENCOUNTER — Ambulatory Visit

## 2024-01-29 LAB — TB SKIN TEST
Induration: 0 mm
TB Skin Test: NEGATIVE

## 2024-01-29 NOTE — Progress Notes (Signed)
  PPD Reading Note PPD read and results entered in EpicCare. Result: 0 mm induration. Interpretation: Negative If test not read within 48-72 hours of initial placement, patient advised to repeat in other arm 1-3 weeks after this test. Allergic reaction: Redness from Band-Aid near area noted around area.

## 2024-03-16 ENCOUNTER — Ambulatory Visit: Payer: Self-pay

## 2024-03-16 NOTE — Telephone Encounter (Signed)
 Copied from CRM [id number]. Topic: Clinical - Red Word Triage >> Mar 16, 2024  2:41 PM [employer] wrote: Red Word that prompted transfer to Nurse Triage: Swollen ankles.  Chief Complaint: bilateral ankle swelling Symptoms: swelling, pain Frequency: constant Pertinent Negatives: Patient denies cp, sob Disposition: [] ED /[] Urgent Care (no appt availability in office) / [x] Appointment(In office/virtual)/ []  [facility]/ [] Home Care/ [] Refused Recommended Disposition /[] [facility]/ []  Follow-up with PCP Additional Notes: apt made for tomorrow; care advice given, denies questions; instructed to go to ER if becomes worse.   Reason for Disposition  MILD or MODERATE ankle swelling (e.g., can't move joint normally, can't do usual activities) (Exceptions: Itchy, localized swelling; swelling is chronic.)  Answer Assessment - Initial Assessment Questions 1. LOCATION: "Which ankle is swollen?" "Where is the swelling?"     both 2. ONSET: "When did the swelling start?"     This year 3. SWELLING: "How bad is the swelling?" Or, "How large is it?" (e.g., mild, moderate, severe; size of localized swelling)    - NONE: No joint swelling.   - LOCALIZED: Localized; small area of puffy or swollen skin (e.g., insect bite, skin irritation).   - MILD: Joint looks or feels mildly swollen or puffy.   - MODERATE: Swollen; interferes with normal activities (e.g., work or school); decreased range of movement; may be limping.   - SEVERE: Very swollen; can't move swollen joint at all; limping a lot or unable to walk.     severe 4. PAIN: "Is there any pain?" If Yes, ask: "How bad is it?" (Scale 1-10; or mild, moderate, severe)   - NONE (0): no pain.   - MILD (1-3): doesn't interfere with normal activities.    - MODERATE (4-7): interferes with normal activities (e.g., work or school) or awakens from sleep, limping.    - SEVERE (8-10): excruciating pain, unable to do any normal activities, unable to  walk.      mild 5. CAUSE: "What do you think caused the ankle swelling?"     heart 6. OTHER SYMPTOMS: "Do you have any other symptoms?" (e.g., fever, chest pain, difficulty breathing, calf pain)     denies 7. PREGNANCY: "Is there any chance you are pregnant?" "When was your last menstrual period?"     na  Protocols used: Ankle Swelling-A-AH

## 2024-03-17 ENCOUNTER — Encounter: Payer: Self-pay | Admitting: Emergency Medicine

## 2024-03-17 ENCOUNTER — Ambulatory Visit: Admitting: Emergency Medicine

## 2024-03-17 VITALS — BP 150/82 | HR 83 | Temp 98.4°F | Ht 65.0 in | Wt 172.0 lb

## 2024-03-17 DIAGNOSIS — E785 Hyperlipidemia, unspecified: Secondary | ICD-10-CM

## 2024-03-17 DIAGNOSIS — I10 Essential (primary) hypertension: Secondary | ICD-10-CM

## 2024-03-17 DIAGNOSIS — N1832 Chronic kidney disease, stage 3b: Secondary | ICD-10-CM

## 2024-03-17 DIAGNOSIS — L03116 Cellulitis of left lower limb: Secondary | ICD-10-CM

## 2024-03-17 DIAGNOSIS — L03115 Cellulitis of right lower limb: Secondary | ICD-10-CM | POA: Insufficient documentation

## 2024-03-17 LAB — COMPREHENSIVE METABOLIC PANEL WITH GFR
ALT: 21 U/L (ref 0–35)
AST: 23 U/L (ref 0–37)
Albumin: 4.4 g/dL (ref 3.5–5.2)
Alkaline Phosphatase: 69 U/L (ref 39–117)
BUN: 21 mg/dL (ref 6–23)
CO2: 26 meq/L (ref 19–32)
Calcium: 9.7 mg/dL (ref 8.4–10.5)
Chloride: 105 meq/L (ref 96–112)
Creatinine, Ser: 0.93 mg/dL (ref 0.40–1.20)
GFR: 62.38 mL/min (ref >60.00)
Glucose, Bld: 99 mg/dL (ref 70–99)
Potassium: 3.3 meq/L — ABNORMAL LOW (ref 3.5–5.1)
Sodium: 143 meq/L (ref 135–145)
Total Bilirubin: 0.6 mg/dL (ref 0.2–1.2)
Total Protein: 7.9 g/dL (ref 6.0–8.3)

## 2024-03-17 LAB — CBC WITH DIFFERENTIAL/PLATELET
Basophils Absolute: 0 10*3/uL (ref 0.0–0.1)
Basophils Relative: 0.5 % (ref 0.0–3.0)
Eosinophils Absolute: 0.3 10*3/uL (ref 0.0–0.7)
Eosinophils Relative: 3.7 % (ref 0.0–5.0)
HCT: 46 % (ref 36.0–46.0)
Hemoglobin: 15.4 g/dL — ABNORMAL HIGH (ref 12.0–15.0)
Lymphocytes Relative: 46.4 % — ABNORMAL HIGH (ref 12.0–46.0)
Lymphs Abs: 3.5 10*3/uL (ref 0.7–4.0)
MCHC: 33.5 g/dL (ref 30.0–36.0)
MCV: 88.7 fl (ref 78.0–100.0)
Monocytes Absolute: 0.6 10*3/uL (ref 0.1–1.0)
Monocytes Relative: 8.1 % (ref 3.0–12.0)
Neutro Abs: 3.1 10*3/uL (ref 1.4–7.7)
Neutrophils Relative %: 41.3 % — ABNORMAL LOW (ref 43.0–77.0)
Platelets: 251 10*3/uL (ref 150.0–400.0)
RBC: 5.19 Mil/uL — ABNORMAL HIGH (ref 3.87–5.11)
RDW: 13.6 % (ref 11.5–15.5)
WBC: 7.5 10*3/uL (ref 4.0–10.5)

## 2024-03-17 MED ORDER — CEFADROXIL 500 MG PO CAPS: 500.0000 mg | ORAL_CAPSULE | Freq: Two times a day (BID) | ORAL | 0 refills | Status: AC

## 2024-03-17 NOTE — Patient Instructions (Signed)
 Cellulitis, Adult    Cellulitis is a skin infection. The infected area is often warm, red, swollen, and sore. It occurs most often on the legs, feet, and toes, but can happen on any part of the body.  This condition can be life-threatening without treatment. It is very important to get treated right away.  What are the causes?  This condition is caused by bacteria. The bacteria enter through a break in the skin, such as:  A cut.  A burn.  A bug bite.  An animal bite.  An open sore.  A crack.  What increases the risk?  Having a weak body's defense system (immune system).  Being older than 70 years old.  Having a blood sugar problem (diabetes).  Having a long-term liver disease (cirrhosis) or kidney disease.  Being very overweight (obese).  Having a skin problem, such as:  An itchy rash.  A rash caused by a fungus.  A rash with blisters.  Slow movement of blood in the veins (venous stasis).  Fluid buildup below the skin (edema).  This condition is more likely to occur in people who:  Have open cuts, burns, bites, or scrapes on the skin.  Have been treated with high-energy rays (radiation).  Use IV drugs.  What are the signs or symptoms?  Skin that:  Looks red or purple, or slightly darker than your usual skin color.  Has streaks.  Has spots.  Is swollen.  Is sore or painful when you touch it.  Is warm.  A fever.  Chills.  Blisters.  Tiredness (fatigue).  How is this treated?  Medicines to treat infections or allergies.  Rest.  Placing cold or warm cloths on the skin.  Staying in the hospital, if the condition is very bad. You may need medicines through an IV.  Follow these instructions at home:  Medicines  Take over-the-counter and prescription medicines only as told by your doctor.  If you were prescribed antibiotics, take them as told by your doctor. Do not stop using them even if you start to feel better.  General instructions  Drink enough fluid to keep your pee (urine) pale yellow.  Do not touch or rub the  infected area.  Raise (elevate) the infected area above the level of your heart while you are sitting or lying down.  Return to your normal activities when your doctor says that it is safe.  Place cold or warm cloths on the area as told by your doctor.  Keep all follow-up visits. Your doctor will need to make sure that a more serious infection is not developing.  Contact a doctor if:  You have a fever.  You do not start to get better after 1-2 days of treatment.  Your bone or joint under the infected area starts to hurt after the skin has healed.  Your infection comes back in the same area or another area. Signs of this may include:  You have a swollen bump in the area.  Your red area gets larger, turns dark in color, or hurts more.  You have more fluid coming from the wound.  Pus or a bad smell develops in your infected area.  You have more pain.  You feel sick and have muscle aches and weakness.  You develop vomiting or watery poop that will not go away.  Get help right away if:  You see red streaks coming from the area.  You notice the skin turns purple or black and falls  off.  These symptoms may be an emergency. Get help right away. Call 911.  Do not wait to see if the symptoms will go away.  Do not drive yourself to the hospital.  This information is not intended to replace advice given to you by your health care provider. Make sure you discuss any questions you have with your health care provider.  Document Revised: 06/12/2022 Document Reviewed: 06/12/2022  Elsevier Patient Education  2024 [employer].

## 2024-03-17 NOTE — Assessment & Plan Note (Signed)
 Acute problem.  Clinically stable.  Afebrile. Recommend to start Duricef 500 mg twice a day for 10 days ED precautions given Advised to contact the office if no better or worse during the next several days

## 2024-03-17 NOTE — Assessment & Plan Note (Signed)
 Advised to stay well-hydrated and avoid NSAIDs. Blood work done today.

## 2024-03-17 NOTE — Assessment & Plan Note (Signed)
Good weight.  Continue rosuvastatin 10 mg daily. Diet and nutrition discussed as well as importance of physical exercise. Fasting lipid profile done today. Follow-up in 6 months.

## 2024-03-17 NOTE — Assessment & Plan Note (Signed)
 Elevated blood pressure reading in the office today.  States she did not take her amlodipine today Cardiovascular risks associated with uncontrolled hypertension discussed Dietary approaches to stop hypertension discussed Benefits of exercise discussed Continue amlodipine 10 mg daily and Hyzaar 100-12.5 mg daily Blood work done today Follow-up in 6 months

## 2024-03-17 NOTE — Progress Notes (Signed)
 Jacqueline Thompson 68 y.o.   Chief Complaint  Patient presents with   Foot Swelling    Patient here for for ankle swelling in both legs for about 2 weeks right leg seems to have gone slightly. she does elevate her legs. Tender to the touch.     HISTORY OF PRESENT ILLNESS: This is a 68 y.o. female complaining of redness and swelling to both extremities for the past 2 weeks No associated symptoms.  Denies injury. No other complaints or medical concerns today.    HPI   Prior to Admission medications   Medication Sig Start Date End Date Taking? Authorizing Provider  albuterol  (VENTOLIN  HFA) 108 (90 Base) MCG/ACT inhaler Inhale 2 puffs into the lungs every 4 (four) hours as needed for wheezing or shortness of breath (cough, shortness of breath or wheezing.). 11/22/23  Yes Wellington Half, FNP  amLODipine  (NORVASC ) 10 MG tablet TAKE 1 TABLET BY MOUTH EVERY DAY 09/07/23  Yes Sherell Christoffel, Isidro Margo, MD  fluticasone  (FLONASE ) 50 MCG/ACT nasal spray Place 2 sprays into both nostrils daily. 11/22/23  Yes Wellington Half, FNP  losartan -hydrochlorothiazide  (HYZAAR) 100-12.5 MG tablet TAKE 1 TABLET BY MOUTH EVERY DAY 09/07/23  Yes Kimmarie Pascale Jose, MD  rosuvastatin  (CRESTOR ) 10 MG tablet TAKE 1 TABLET BY MOUTH EVERY DAY 01/16/24 04/15/24 Yes Elvira Hammersmith, MD    No Known Allergies  Patient Active Problem List   Diagnosis Date Noted   Allergic dermatitis 01/28/2024   Skin infection 01/28/2024   Stage 3b chronic kidney disease (HCC) 11/27/2023   Dyslipidemia 02/20/2021   Atherosclerosis of aorta (HCC) 01/08/2019   Elevated LDL cholesterol level 05/20/2018   Essential hypertension 02/22/2015    Past Medical History:  Diagnosis Date   Allergy    Hypertension     Past Surgical History:  Procedure Laterality Date   ABDOMINAL HYSTERECTOMY  2007   Birth Mark Removed from Face Left     Social History   Socioeconomic History   Marital status: Legally Separated     Spouse name: Not on file   Number of children: 0   Years of education: Not on file   Highest education level: Master's degree (e.g., MA, MS, MEng, MEd, MSW, MBA)  Occupational History   Occupation: distrubution specialist  Tobacco Use   Smoking status: Former    Current packs/day: 0.00    Average packs/day: 0.8 packs/day for 10.1 years (7.6 ttl pk-yrs)    Types: Cigarettes    Start date: 15    Quit date: 11/29/1997    Years since quitting: 26.3   Smokeless tobacco: Never   Tobacco comments:    smoked a pack per week  Vaping Use   Vaping status: Never Used  Substance and Sexual Activity   Alcohol use: Yes    Alcohol/week: 4.0 standard drinks of alcohol    Types: 3 Glasses of wine, 1 Shots of liquor per week    Comment: 2 drinks a week   Drug use: No   Sexual activity: Yes    Partners: Male    Birth control/protection: None  Other Topics Concern   Not on file  Social History Narrative   Marital status: married   Children: none   Lives with: husband           Seatbelt: 100%   Guns in home: no         Exercise - active at work   Sleep - good   Social Drivers of Health  Financial Resource Strain: Low Risk  (11/23/2023)   Overall Financial Resource Strain (CARDIA)    Difficulty of Paying Living Expenses: Not very hard  Food Insecurity: No Food Insecurity (11/23/2023)   Hunger Vital Sign    Worried About Running Out of Food in the Last Year: Never true    Ran Out of Food in the Last Year: Never true  Transportation Needs: No Transportation Needs (11/23/2023)   PRAPARE - Administrator, Civil Service (Medical): No    Lack of Transportation (Non-Medical): No  Physical Activity: Sufficiently Active (11/23/2023)   Exercise Vital Sign    Days of Exercise per Week: 7 days    Minutes of Exercise per Session: 30 min  Stress: No Stress Concern Present (11/23/2023)   [employer] of Occupational Health - Occupational Stress Questionnaire    Feeling of  Stress : Only a little  Social Connections: Moderately Isolated (11/23/2023)   Social Connection and Isolation Panel [NHANES]    Frequency of Communication with Friends and Family: Three times a week    Frequency of Social Gatherings with Friends and Family: Three times a week    Attends Religious Services: Never    Active Member of Clubs or Organizations: No    Attends Engineer, structural: Not on file    Marital Status: Married  Catering manager Violence: Not on file    Family History  Problem Relation Age of Onset   Heart disease Mother    Cancer Father        colon,prostate and spine   Stroke Brother    Colon cancer Neg Hx    Esophageal cancer Neg Hx    Pancreatic cancer Neg Hx    Rectal cancer Neg Hx    Stomach cancer Neg Hx    Breast cancer Neg Hx      Review of Systems  Constitutional: Negative.  Negative for chills and fever.  HENT: Negative.  Negative for congestion and sore throat.   Respiratory: Negative.  Negative for cough and shortness of breath.   Cardiovascular: Negative.  Negative for chest pain and palpitations.  Gastrointestinal:  Negative for abdominal pain, diarrhea, nausea and vomiting.  Genitourinary: Negative.  Negative for dysuria and hematuria.  Skin:  Positive for rash.  Neurological: Negative.  Negative for dizziness and headaches.  All other systems reviewed and are negative.   Vitals:   03/17/24 1304  BP: (!) 150/82  Pulse: 83  Temp: 98.4 F (36.9 C)  SpO2: 94%    Physical Exam Vitals reviewed.  Constitutional:      Appearance: Normal appearance.  HENT:     Head: Normocephalic.     Mouth/Throat:     Mouth: Mucous membranes are moist.     Pharynx: Oropharynx is clear.  Eyes:     Extraocular Movements: Extraocular movements intact.     Conjunctiva/sclera: Conjunctivae normal.     Pupils: Pupils are equal, round, and reactive to light.  Cardiovascular:     Rate and Rhythm: Normal rate and regular rhythm.     Pulses:  Normal pulses.     Heart sounds: Normal heart sounds.  Pulmonary:     Effort: Pulmonary effort is normal.     Breath sounds: Normal breath sounds.  Musculoskeletal:     Cervical back: No tenderness.  Lymphadenopathy:     Cervical: No cervical adenopathy.  Skin:    General: Skin is warm and dry.     Capillary Refill: Capillary refill takes less  than 2 seconds.     Comments: Lower extremities: Several patches of erythema at the skin with central purpuric blanches and mild swelling compatible with cellulitis  Neurological:     General: No focal deficit present.     Mental Status: She is alert and oriented to person, place, and time.  Psychiatric:        Mood and Affect: Mood normal.        Behavior: Behavior normal.      ASSESSMENT & PLAN: A total of 42 minutes was spent with the patient and counseling/coordination of care regarding preparing for this visit, review of most recent office visit notes, review of multiple chronic medical conditions and their management, diagnosis of lower leg cellulitis and need for antibiotics, review of all medications, review of most recent bloodwork results, review of health maintenance items, education on nutrition, prognosis, documentation, and need for follow up.   Problem List Items Addressed This Visit       Cardiovascular and Mediastinum   Essential hypertension   Elevated blood pressure reading in the office today.  States she did not take her amlodipine  today Cardiovascular risks associated with uncontrolled hypertension discussed Dietary approaches to stop hypertension discussed Benefits of exercise discussed Continue amlodipine  10 mg daily and Hyzaar 100-12.5 mg daily Blood work done today Follow-up in 6 months        Genitourinary   Stage 3b chronic kidney disease (HCC)   Advised to stay well-hydrated and avoid NSAIDs Blood work done today        Other   Dyslipidemia   Good weight.  Continue rosuvastatin  10 mg daily. Diet  and nutrition discussed as well as importance of physical exercise. Fasting lipid profile done today. Follow-up in 6 months.      Bilateral lower leg cellulitis - Primary   Acute problem.  Clinically stable.  Afebrile. Recommend to start Duricef 500 mg twice a day for 10 days ED precautions given Advised to contact the office if no better or worse during the next several days      Relevant Medications   cefadroxil  (DURICEF) 500 MG capsule   Other Relevant Orders   Comprehensive metabolic panel with GFR   CBC with Differential/Platelet   Patient Instructions  Cellulitis, Adult  Cellulitis is a skin infection. The infected area is often warm, red, swollen, and sore. It occurs most often on the legs, feet, and toes, but can happen on any part of the body. This condition can be life-threatening without treatment. It is very important to get treated right away. What are the causes? This condition is caused by bacteria. The bacteria enter through a break in the skin, such as: A cut. A burn. A bug bite. An animal bite. An open sore. A crack. What increases the risk? Having a weak body's defense system (immune system). Being older than 70 years old. Having a blood sugar problem (diabetes). Having a long-term liver disease (cirrhosis) or kidney disease. Being very overweight (obese). Having a skin problem, such as: An itchy rash. A rash caused by a fungus. A rash with blisters. Slow movement of blood in the veins (venous stasis). Fluid buildup below the skin (edema). This condition is more likely to occur in people who: Have open cuts, burns, bites, or scrapes on the skin. Have been treated with high-energy rays (radiation). Use IV drugs. What are the signs or symptoms? Skin that: Looks red or purple, or slightly darker than your usual skin color. Has streaks. Has  spots. Is swollen. Is sore or painful when you touch it. Is warm. A fever. Chills. Blisters. Tiredness  (fatigue). How is this treated? Medicines to treat infections or allergies. Rest. Placing cold or warm cloths on the skin. Staying in the hospital, if the condition is very bad. You may need medicines through an IV. Follow these instructions at home: Medicines Take over-the-counter and prescription medicines only as told by your doctor. If you were prescribed antibiotics, take them as told by your doctor. Do not stop using them even if you start to feel better. General instructions Drink enough fluid to keep your pee (urine) pale yellow. Do not touch or rub the infected area. Raise (elevate) the infected area above the level of your heart while you are sitting or lying down. Return to your normal activities when your doctor says that it is safe. Place cold or warm cloths on the area as told by your doctor. Keep all follow-up visits. Your doctor will need to make sure that a more serious infection is not developing. Contact a doctor if: You have a fever. You do not start to get better after 1-2 days of treatment. Your bone or joint under the infected area starts to hurt after the skin has healed. Your infection comes back in the same area or another area. Signs of this may include: You have a swollen bump in the area. Your red area gets larger, turns dark in color, or hurts more. You have more fluid coming from the wound. Pus or a bad smell develops in your infected area. You have more pain. You feel sick and have muscle aches and weakness. You develop vomiting or watery poop that will not go away. Get help right away if: You see red streaks coming from the area. You notice the skin turns purple or black and falls off. These symptoms may be an emergency. Get help right away. Call 911. Do not wait to see if the symptoms will go away. Do not drive yourself to the hospital. This information is not intended to replace advice given to you by your health care provider. Make sure you  discuss any questions you have with your health care provider. Document Revised: 06/12/2022 Document Reviewed: 06/12/2022 Elsevier Patient Education  2024 [employer]    Maryagnes Small, MD [facility]

## 2024-03-18 ENCOUNTER — Ambulatory Visit: Payer: Self-pay | Admitting: Emergency Medicine

## 2024-03-31 ENCOUNTER — Encounter: Payer: Self-pay | Admitting: Emergency Medicine

## 2024-03-31 ENCOUNTER — Ambulatory Visit: Admitting: Emergency Medicine

## 2024-03-31 VITALS — BP 156/88 | HR 65 | Temp 98.0°F | Ht 65.0 in | Wt 170.2 lb

## 2024-03-31 DIAGNOSIS — I10 Essential (primary) hypertension: Secondary | ICD-10-CM | POA: Diagnosis not present

## 2024-03-31 DIAGNOSIS — I776 Arteritis, unspecified: Secondary | ICD-10-CM | POA: Diagnosis not present

## 2024-03-31 DIAGNOSIS — N1832 Chronic kidney disease, stage 3b: Secondary | ICD-10-CM | POA: Diagnosis not present

## 2024-03-31 DIAGNOSIS — I700 Atherosclerosis of aorta: Secondary | ICD-10-CM

## 2024-03-31 DIAGNOSIS — E785 Hyperlipidemia, unspecified: Secondary | ICD-10-CM | POA: Diagnosis not present

## 2024-03-31 MED ORDER — PREDNISONE 20 MG PO TABS: 40.0000 mg | ORAL_TABLET | Freq: Every day | ORAL | 0 refills | Status: AC

## 2024-03-31 MED ORDER — VALSARTAN-HYDROCHLOROTHIAZIDE 320-12.5 MG PO TABS: 1.0000 | ORAL_TABLET | Freq: Every day | ORAL | 3 refills | Status: AC

## 2024-03-31 NOTE — Assessment & Plan Note (Signed)
 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

## 2024-03-31 NOTE — Progress Notes (Signed)
 Jacqueline Thompson 68 y.o.   Chief Complaint  Patient presents with   Rash    Bilateral leg rash/swelling for several months. Currently treating with peroxide, topical lotion, aloe at home. Does have appointment to dermatology but will not be able to get in until October    HISTORY OF PRESENT ILLNESS: This is a 68 y.o. female here for follow-up of office visit on 03/17/2024 when she presented with a rash to lower extremities.  Initial diagnosis bilateral leg cellulitis.  Started on Duricef 500 mg twice a day which she took for 7 full days. Rash is slightly improved. History of hypertension and dyslipidemia No other complaints or medical concerns today.  Rash Pertinent negatives include no cough, diarrhea, fever, shortness of breath, sore throat or vomiting.     Prior to Admission medications   Medication Sig Start Date End Date Taking? Authorizing Provider  albuterol  (VENTOLIN  HFA) 108 (90 Base) MCG/ACT inhaler Inhale 2 puffs into the lungs every 4 (four) hours as needed for wheezing or shortness of breath (cough, shortness of breath or wheezing.). 11/22/23  Yes Wellington Half, FNP  amLODipine  (NORVASC ) 10 MG tablet TAKE 1 TABLET BY MOUTH EVERY DAY 09/07/23  Yes Railee Bonillas, Isidro Margo, MD  fluticasone  (FLONASE ) 50 MCG/ACT nasal spray Place 2 sprays into both nostrils daily. 11/22/23  Yes Wellington Half, FNP  losartan -hydrochlorothiazide  (HYZAAR) 100-12.5 MG tablet TAKE 1 TABLET BY MOUTH EVERY DAY 09/07/23  Yes Dannya Pitkin Jose, MD  rosuvastatin  (CRESTOR ) 10 MG tablet TAKE 1 TABLET BY MOUTH EVERY DAY 01/16/24 04/15/24 Yes Elvira Hammersmith, MD    No Known Allergies  Patient Active Problem List   Diagnosis Date Noted   Stage 3b chronic kidney disease (HCC) 11/27/2023   Dyslipidemia 02/20/2021   Atherosclerosis of aorta (HCC) 01/08/2019   Elevated LDL cholesterol level 05/20/2018   Essential hypertension 02/22/2015    Past Medical History:  Diagnosis Date    Allergy    Hypertension     Past Surgical History:  Procedure Laterality Date   ABDOMINAL HYSTERECTOMY  2007   Birth Mark Removed from Face Left     Social History   Socioeconomic History   Marital status: Legally Separated    Spouse name: Not on file   Number of children: 0   Years of education: Not on file   Highest education level: Master's degree (e.g., MA, MS, MEng, MEd, MSW, MBA)  Occupational History   Occupation: distrubution specialist  Tobacco Use   Smoking status: Former    Current packs/day: 0.00    Average packs/day: 0.8 packs/day for 10.1 years (7.6 ttl pk-yrs)    Types: Cigarettes    Start date: 21    Quit date: 11/29/1997    Years since quitting: 26.3   Smokeless tobacco: Never   Tobacco comments:    smoked a pack per week  Vaping Use   Vaping status: Never Used  Substance and Sexual Activity   Alcohol use: Yes    Alcohol/week: 4.0 standard drinks of alcohol    Types: 3 Glasses of wine, 1 Shots of liquor per week    Comment: 2 drinks a week   Drug use: No   Sexual activity: Yes    Partners: Male    Birth control/protection: None  Other Topics Concern   Not on file  Social History Narrative   Marital status: married   Children: none   Lives with: husband           Seatbelt:  100%   Guns in home: no         Exercise - active at work   Sleep - good   Social Drivers of Corporate investment banker Strain: Low Risk  (11/23/2023)   Overall Financial Resource Strain (CARDIA)    Difficulty of Paying Living Expenses: Not very hard  Food Insecurity: No Food Insecurity (11/23/2023)   Hunger Vital Sign    Worried About Running Out of Food in the Last Year: Never true    Ran Out of Food in the Last Year: Never true  Transportation Needs: No Transportation Needs (11/23/2023)   PRAPARE - Administrator, Civil Service (Medical): No    Lack of Transportation (Non-Medical): No  Physical Activity: Sufficiently Active (11/23/2023)   Exercise  Vital Sign    Days of Exercise per Week: 7 days    Minutes of Exercise per Session: 30 min  Stress: No Stress Concern Present (11/23/2023)   [employer] of Occupational Health - Occupational Stress Questionnaire    Feeling of Stress : Only a little  Social Connections: Moderately Isolated (11/23/2023)   Social Connection and Isolation Panel [NHANES]    Frequency of Communication with Friends and Family: Three times a week    Frequency of Social Gatherings with Friends and Family: Three times a week    Attends Religious Services: Never    Active Member of Clubs or Organizations: No    Attends Engineer, structural: Not on file    Marital Status: Married  Catering manager Violence: Not on file    Family History  Problem Relation Age of Onset   Heart disease Mother    Cancer Father        colon,prostate and spine   Stroke Brother    Colon cancer Neg Hx    Esophageal cancer Neg Hx    Pancreatic cancer Neg Hx    Rectal cancer Neg Hx    Stomach cancer Neg Hx    Breast cancer Neg Hx      Review of Systems  Constitutional: Negative.  Negative for chills and fever.  HENT:  Negative for sore throat.   Respiratory: Negative.  Negative for cough and shortness of breath.   Cardiovascular: Negative.  Negative for chest pain and palpitations.  Gastrointestinal:  Negative for abdominal pain, diarrhea, nausea and vomiting.  Genitourinary:  Negative for dysuria and hematuria.  Skin:  Positive for rash.  Neurological: Negative.  Negative for dizziness and headaches.  All other systems reviewed and are negative.   Vitals:   03/31/24 1258  Pulse: 65  Temp: 98 F (36.7 C)  SpO2: 95%    Physical Exam Vitals reviewed.  Constitutional:      Appearance: Normal appearance.  HENT:     Head: Normocephalic.  Eyes:     Extraocular Movements: Extraocular movements intact.  Cardiovascular:     Rate and Rhythm: Normal rate.  Pulmonary:     Effort: Pulmonary effort is normal.   Skin:    General: Skin is warm and dry.     Findings: Rash present.  Neurological:     Mental Status: She is alert and oriented to person, place, and time.  Psychiatric:        Mood and Affect: Mood normal.        Behavior: Behavior normal.           ASSESSMENT & PLAN: A total of 43 minutes was spent with the patient and counseling/coordination of  care regarding preparing for this visit, review of most recent office visit notes, review of multiple chronic medical conditions and their management, diagnosis of vasculitis and need for vascular surgery evaluation, need to start daily oral prednisone , review of all medications, review of most recent bloodwork results, review of health maintenance items, education on nutrition, prognosis, documentation, and need for follow up.  Problem List Items Addressed This Visit       Cardiovascular and Mediastinum   Essential hypertension   BP Readings from Last 3 Encounters:  03/31/24 (!) 156/88  03/17/24 (!) 150/82  01/28/24 (!) 164/90  Elevated blood pressure reading in the office today.  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  Continue rosuvastatin  10 mg daily. Diet and nutrition discussed as well as importance of physical exercise. Fasting lipid profile done today. Follow-up in 6 months.      Patient Instructions  Vasculitis  Vasculitis is inflammation of the blood vessels. It can cause the blood vessels to become thick, narrow, scarred, or weak. It can also reduce blood flow. This can cause damage to the muscles, kidneys, lungs, brain, and other parts of the body. There are many types of vasculitis. Different types may affect different kinds of blood vessels or parts of the body. Some types last only a short time. Others last a long time. What are the causes? The exact cause of vasculitis is not known. In some cases, it can happen when the body's defense system (immune system) starts to attack its own blood vessels. This attack can be caused by: An infection. An immune system disease, such as lupus, rheumatoid arthritis, or scleroderma. An allergic reaction to a medicine. A cancer that affects blood cells, such as leukemia or lymphoma. What increases the risk? You may be more likely to get this condition if: You smoke. You are under stress. You have a physical injury. What are the signs or symptoms? Symptoms depend on the type of vasculitis that you have. Common symptoms include: Fever. Poor appetite. Weight loss. Feeling very tired (fatigue) or weak. Having aches and pains. Numbness in an area of your body. Certain types of vasculitis may result in: Skin problems, such as sores, spots, or rashes. Trouble seeing. Trouble breathing. Coughing up blood. Blood in your pee (urine). Headaches. Stomach pain. A stuffy or bloody nose. How is this diagnosed? This condition may be diagnosed based on your symptoms and a physical exam. You may also  have tests. These may include: Blood tests.  A pee test. A biopsy of a blood vessel. This is when a small part of the blood vessel is removed for testing. A nerve conduction study. This is a test of the electrical signals that move through nerves. Imaging tests, such as: X-ray. CT scan. Ultrasound. MRI. Angiogram. How is this treated? In some cases, treatment is not needed. Treatment will be based on the type of vasculitis you have. If you have a mild case, common pain relievers might help. For serious cases, treatment may include: Medicines that lower inflammation in your body. Medicines that reduce the activity of the immune system (immunosuppressants). You will need to see your health care provider while you are being treated. During follow-up visits, your provider may: Do blood tests and bone density tests. Check your blood pressure and blood sugar. Check for side effects of any medicines you are taking. Vasculitis cannot always be cured. In some cases, symptoms go away but the disease does not. If symptoms come back, more treatment may be needed. Follow these instructions at home: Take over-the-counter and prescription medicines only as told by your provider. Follow a healthy diet. Eat fruits, vegetables, whole grains, and healthy sources of protein. Rest as told by your provider. Talk with your provider about what exercises are safe for you. Learn as much as you can about vasculitis. Think about joining a support group. You may need to: Talk to others who have vasculitis. This may help you manage your condition. Talk with your provider if you feel stressed, worried, or depressed. Where to find more information Vasculitis Foundation: vasculitisfoundation.org Contact a health care provider if: Your symptoms come back. You have new symptoms. Your fever, fatigue, headache, or weight loss gets worse. You have signs of infection. These may include redness, swelling, tenderness,  warmth, or a new fever. Your pain does not go away, even after you take pain medicine. Your nose bleeds. Get help right away if: Your vision gets worse. You have chest pain or stomach pain. You have trouble breathing. One side of your face or body becomes weak or numb all of a sudden. There is blood in your pee. These symptoms may be an emergency. Get help right away. Call 911. Do not wait to see if the symptoms will go away. Do not drive yourself to the hospital. This information is not intended to replace advice given to you by your health care provider. Make sure you discuss any questions you have with your health care provider. Document Revised: 10/30/2022 Document Reviewed: 10/30/2022 Elsevier Patient Education  2024 Elsevier Inc.     Maryagnes Small, MD [facility]

## 2024-03-31 NOTE — Patient Instructions (Addendum)
 Continue amlodipine  10 mg daily Stop losartan  HCT Start valsartan HCT 320-12.5 mg daily Start prednisone  as prescribed Follow-up with vascular surgeon Follow-up with me after that  Vasculitis  Vasculitis is inflammation of the blood vessels. It can cause the blood vessels to become thick, narrow, scarred, or weak. It can also reduce blood flow. This can cause damage to the muscles, kidneys, lungs, brain, and other parts of the body. There are many types of vasculitis. Different types may affect different kinds of blood vessels or parts of the body. Some types last only a short time. Others last a long time. What are the causes? The exact cause of vasculitis is not known. In some cases, it can happen when the body's defense system (immune system) starts to attack its own blood vessels. This attack can be caused by: An infection. An immune system disease, such as lupus, rheumatoid arthritis, or scleroderma. An allergic reaction to a medicine. A cancer that affects blood cells, such as leukemia or lymphoma. What increases the risk? You may be more likely to get this condition if: You smoke. You are under stress. You have a physical injury. What are the signs or symptoms? Symptoms depend on the type of vasculitis that you have. Common symptoms include: Fever. Poor appetite. Weight loss. Feeling very tired (fatigue) or weak. Having aches and pains. Numbness in an area of your body. Certain types of vasculitis may result in: Skin problems, such as sores, spots, or rashes. Trouble seeing. Trouble breathing. Coughing up blood. Blood in your pee (urine). Headaches. Stomach pain. A stuffy or bloody nose. How is this diagnosed? This condition may be diagnosed based on your symptoms and a physical exam. You may also have tests. These may include: Blood tests. A pee test. A biopsy of a blood vessel. This is when a small part of the blood vessel is removed for testing. A nerve  conduction study. This is a test of the electrical signals that move through nerves. Imaging tests, such as: X-ray. CT scan. Ultrasound. MRI. Angiogram. How is this treated? In some cases, treatment is not needed. Treatment will be based on the type of vasculitis you have. If you have a mild case, common pain relievers might help. For serious cases, treatment may include: Medicines that lower inflammation in your body. Medicines that reduce the activity of the immune system (immunosuppressants). You will need to see your health care provider while you are being treated. During follow-up visits, your provider may: Do blood tests and bone density tests. Check your blood pressure and blood sugar. Check for side effects of any medicines you are taking. Vasculitis cannot always be cured. In some cases, symptoms go away but the disease does not. If symptoms come back, more treatment may be needed. Follow these instructions at home: Take over-the-counter and prescription medicines only as told by your provider. Follow a healthy diet. Eat fruits, vegetables, whole grains, and healthy sources of protein. Rest as told by your provider. Talk with your provider about what exercises are safe for you. Learn as much as you can about vasculitis. Think about joining a support group. You may need to: Talk to others who have vasculitis. This may help you manage your condition. Talk with your provider if you feel stressed, worried, or depressed. Where to find more information Vasculitis Foundation: vasculitisfoundation.org Contact a health care provider if: Your symptoms come back. You have new symptoms. Your fever, fatigue, headache, or weight loss gets worse. You have signs of infection.  These may include redness, swelling, tenderness, warmth, or a new fever. Your pain does not go away, even after you take pain medicine. Your nose bleeds. Get help right away if: Your vision gets worse. You have  chest pain or stomach pain. You have trouble breathing. One side of your face or body becomes weak or numb all of a sudden. There is blood in your pee. These symptoms may be an emergency. Get help right away. Call 911. Do not wait to see if the symptoms will go away. Do not drive yourself to the hospital. This information is not intended to replace advice given to you by your health care provider. Make sure you discuss any questions you have with your health care provider. Document Revised: 10/30/2022 Document Reviewed: 10/30/2022 Elsevier Patient Education  2024 [employer].

## 2024-03-31 NOTE — Assessment & Plan Note (Signed)
Good weight.  Continue rosuvastatin 10 mg daily. Diet and nutrition discussed as well as importance of physical exercise. Fasting lipid profile done today. Follow-up in 6 months.

## 2024-03-31 NOTE — Assessment & Plan Note (Signed)
 Diet and nutrition discussed Benefits of exercise discussed Lipid profile done today Continue rosuvastatin 10 mg daily

## 2024-03-31 NOTE — Assessment & Plan Note (Addendum)
 BP Readings from Last 3 Encounters:  03/31/24 (!) 156/88  03/17/24 (!) 150/82  01/28/24 (!) 164/90  Persistently elevated blood pressure readings.  Recommend to change medications.  Cardiovascular risks associated with uncontrolled hypertension discussed Dietary approaches to stop hypertension discussed Benefits of exercise discussed Continue amlodipine  10 mg daily and change losartan  HCT to valsartan HCT 320-12.5 mg Follow-up in 6 months

## 2024-03-31 NOTE — Assessment & Plan Note (Signed)
Advised to stay well-hydrated and avoid NSAIDs. ?

## 2024-05-26 ENCOUNTER — Ambulatory Visit: Payer: BC Managed Care – PPO | Admitting: Emergency Medicine

## 2024-05-27 ENCOUNTER — Ambulatory Visit: Admitting: Emergency Medicine

## 2024-05-27 ENCOUNTER — Encounter: Payer: Self-pay | Admitting: Emergency Medicine

## 2024-05-27 ENCOUNTER — Ambulatory Visit: Payer: BC Managed Care – PPO | Admitting: Emergency Medicine

## 2024-05-27 VITALS — BP 138/78 | HR 79 | Temp 98.4°F | Ht 65.0 in | Wt 172.0 lb

## 2024-05-27 DIAGNOSIS — N1832 Chronic kidney disease, stage 3b: Secondary | ICD-10-CM | POA: Diagnosis not present

## 2024-05-27 DIAGNOSIS — Z23 Encounter for immunization: Secondary | ICD-10-CM

## 2024-05-27 DIAGNOSIS — I776 Arteritis, unspecified: Secondary | ICD-10-CM

## 2024-05-27 DIAGNOSIS — E785 Hyperlipidemia, unspecified: Secondary | ICD-10-CM

## 2024-05-27 DIAGNOSIS — I10 Essential (primary) hypertension: Secondary | ICD-10-CM

## 2024-05-27 DIAGNOSIS — I700 Atherosclerosis of aorta: Secondary | ICD-10-CM

## 2024-05-27 NOTE — Assessment & Plan Note (Signed)
 BP Readings from Last 3 Encounters:  05/27/24 138/78  03/31/24 (!) 156/88  03/17/24 (!) 150/82  Well-controlled hypertension Continue amlodipine  10 mg daily and valsartan  HCT 320-12.5 mg daily Cardiovascular risks associated with hypertension discussed Diet and nutrition discussed

## 2024-05-27 NOTE — Assessment & Plan Note (Signed)
 Advised to stay well-hydrated and avoid NSAIDs  On valsartan  HCT daily

## 2024-05-27 NOTE — Progress Notes (Signed)
 Jacqueline Thompson 68 y.o.   Chief Complaint  Patient presents with   Follow-up    Patient here for 6 month f/u for HTN. Patient states she wants her legs looked at again     HISTORY OF PRESENT ILLNESS: This is a 68 y.o. female here for 66-month follow-up of hypertension Seen by me on 03/31/2024 for follow-up of leg rash and cellulitis Diagnosed with possible vasculitis and started on prednisone  which helped Was referred to vascular surgeon.  Has an appointment in a couple weeks No other complaints or medical concerns today. BP Readings from Last 3 Encounters:  05/27/24 138/78  03/31/24 (!) 156/88  03/17/24 (!) 150/82     HPI   Prior to Admission medications   Medication Sig Start Date End Date Taking? Authorizing Provider  albuterol  (VENTOLIN  HFA) 108 (90 Base) MCG/ACT inhaler Inhale 2 puffs into the lungs every 4 (four) hours as needed for wheezing or shortness of breath (cough, shortness of breath or wheezing.). 11/22/23  Yes Alvia Corean CROME, FNP  amLODipine  (NORVASC ) 10 MG tablet TAKE 1 TABLET BY MOUTH EVERY DAY 09/07/23  Yes Xochitl Egle, Emil Schanz, MD  fluticasone  (FLONASE ) 50 MCG/ACT nasal spray Place 2 sprays into both nostrils daily. 11/22/23  Yes Alvia Corean CROME, FNP  rosuvastatin  (CRESTOR ) 10 MG tablet TAKE 1 TABLET BY MOUTH EVERY DAY 01/16/24 05/27/24 Yes Abdoulaye Drum, Emil Schanz, MD  valsartan -hydrochlorothiazide  (DIOVAN -HCT) 320-12.5 MG tablet Take 1 tablet by mouth daily. 03/31/24  Yes Purcell Emil Schanz, MD    No Known Allergies  Patient Active Problem List   Diagnosis Date Noted   Vasculitis (HCC) 03/31/2024   Stage 3b chronic kidney disease (HCC) 11/27/2023   Dyslipidemia 02/20/2021   Atherosclerosis of aorta (HCC) 01/08/2019   Elevated LDL cholesterol level 05/20/2018   Essential hypertension 02/22/2015    Past Medical History:  Diagnosis Date   Allergy    Hypertension     Past Surgical History:  Procedure Laterality Date   ABDOMINAL  HYSTERECTOMY  2007   Birth Mark Removed from Face Left     Social History   Socioeconomic History   Marital status: Legally Separated    Spouse name: Not on file   Number of children: 0   Years of education: Not on file   Highest education level: Master's degree (e.g., MA, MS, MEng, MEd, MSW, MBA)  Occupational History   Occupation: distrubution specialist  Tobacco Use   Smoking status: Former    Current packs/day: 0.00    Average packs/day: 0.8 packs/day for 10.1 years (7.6 ttl pk-yrs)    Types: Cigarettes    Start date: 27    Quit date: 11/29/1997    Years since quitting: 26.5   Smokeless tobacco: Never   Tobacco comments:    smoked a pack per week  Vaping Use   Vaping status: Never Used  Substance and Sexual Activity   Alcohol use: Yes    Alcohol/week: 4.0 standard drinks of alcohol    Types: 3 Glasses of wine, 1 Shots of liquor per week    Comment: 2 drinks a week   Drug use: No   Sexual activity: Yes    Partners: Male    Birth control/protection: None  Other Topics Concern   Not on file  Social History Narrative   Marital status: married   Children: none   Lives with: husband           Seatbelt: 100%   Guns in home: no  Exercise - active at work   Sleep - good   Social Drivers of Health   Financial Resource Strain: Low Risk  (05/23/2024)   Overall Financial Resource Strain (CARDIA)    Difficulty of Paying Living Expenses: Not very hard  Food Insecurity: No Food Insecurity (05/23/2024)   Hunger Vital Sign    Worried About Running Out of Food in the Last Year: Never true    Ran Out of Food in the Last Year: Never true  Transportation Needs: No Transportation Needs (05/23/2024)   PRAPARE - Administrator, Civil Service (Medical): No    Lack of Transportation (Non-Medical): No  Physical Activity: Insufficiently Active (05/23/2024)   Exercise Vital Sign    Days of Exercise per Week: 4 days    Minutes of Exercise per Session: 30 min   Stress: No Stress Concern Present (05/23/2024)   [employer] of Occupational Health - Occupational Stress Questionnaire    Feeling of Stress: Not at all  Social Connections: Moderately Integrated (05/23/2024)   Social Connection and Isolation Panel    Frequency of Communication with Friends and Family: Three times a week    Frequency of Social Gatherings with Friends and Family: Three times a week    Attends Religious Services: 1 to 4 times per year    Active Member of Clubs or Organizations: No    Attends Engineer, structural: Not on file    Marital Status: Married  Catering manager Violence: Not on file    Family History  Problem Relation Age of Onset   Heart disease Mother    Cancer Father        colon,prostate and spine   Stroke Brother    Colon cancer Neg Hx    Esophageal cancer Neg Hx    Pancreatic cancer Neg Hx    Rectal cancer Neg Hx    Stomach cancer Neg Hx    Breast cancer Neg Hx      Review of Systems  Constitutional:  Negative for chills and fever.  HENT: Negative.  Negative for congestion and sore throat.   Respiratory: Negative.  Negative for cough and shortness of breath.   Cardiovascular: Negative.  Negative for chest pain and palpitations.  Gastrointestinal:  Negative for abdominal pain, diarrhea, nausea and vomiting.  Genitourinary: Negative.  Negative for dysuria and hematuria.  Skin:  Positive for rash.  Neurological: Negative.  Negative for dizziness and headaches.  All other systems reviewed and are negative.   Vitals:   05/27/24 1031  BP: 138/78  Pulse: 79  Temp: 98.4 F (36.9 C)  SpO2: 94%    Physical Exam Vitals reviewed.  Constitutional:      Appearance: Normal appearance.  HENT:     Head: Normocephalic.     Mouth/Throat:     Mouth: Mucous membranes are moist.     Pharynx: Oropharynx is clear.  Eyes:     Extraocular Movements: Extraocular movements intact.     Conjunctiva/sclera: Conjunctivae normal.     Pupils:  Pupils are equal, round, and reactive to light.  Cardiovascular:     Rate and Rhythm: Normal rate and regular rhythm.     Pulses: Normal pulses.     Heart sounds: Normal heart sounds.  Pulmonary:     Effort: Pulmonary effort is normal.     Breath sounds: Normal breath sounds.  Musculoskeletal:     Cervical back: No tenderness.  Lymphadenopathy:     Cervical: No cervical adenopathy.  Skin:  General: Skin is warm and dry.     Capillary Refill: Capillary refill takes less than 2 seconds.     Comments: Lower extremities: Vasculitic rash with purpuric lesions  Neurological:     General: No focal deficit present.     Mental Status: She is alert and oriented to person, place, and time.  Psychiatric:        Mood and Affect: Mood normal.        Behavior: Behavior normal.      ASSESSMENT & PLAN: A total of 45 minutes was spent with the patient and counseling/coordination of care regarding preparing for this visit, review of most recent office visit notes, review of multiple chronic medical conditions and their management, review of all medications, review of most recent bloodwork results, review of health maintenance items, education on nutrition, prognosis, documentation, and need for follow up.   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 Recommend vascular  surgery evaluation Has appointment in 2 weeks. ED precautions given We will follow-up after vascular surgery evaluation        Genitourinary   Stage 3b chronic kidney disease (HCC)   Advised to stay well-hydrated and avoid NSAIDs  On valsartan  HCT daily        Other   Dyslipidemia   Good weight.  Continue rosuvastatin  10 mg daily. Diet and nutrition discussed as well as importance of physical exercise. Follow-up in 6 months.      Other Visit Diagnoses       Need for vaccination       Relevant Orders   Zoster Recombinant (Shingrix  ) (Completed)      Patient Instructions  Hypertension, Adult High blood pressure (hypertension) is when the force of blood pumping through the arteries is too strong. The arteries are the blood vessels that carry blood from the heart throughout the body. Hypertension forces the heart to work harder to pump blood and may cause arteries to become narrow or stiff. Untreated or uncontrolled hypertension can lead to a heart attack, heart failure, a stroke, kidney disease, and other problems. A blood pressure reading consists of a higher number over a lower number. Ideally, your blood pressure should be below 120/80. The first (top) number is called the systolic pressure. It is a measure of the pressure in your arteries as your heart beats. The second (bottom) number is called the diastolic pressure. It is a measure of the pressure in your arteries as the heart relaxes. What are the causes? The exact cause of this condition is not known. There are some conditions that result in high blood pressure. What increases the risk? Certain factors may make you more likely to develop high blood pressure. Some of these risk factors are under your control, including: Smoking. Not getting enough exercise or physical activity. Being overweight. Having too much fat, sugar, calories, or salt (  sodium) in your diet. Drinking too much alcohol. Other risk factors  include: Having a personal history of heart disease, diabetes, high cholesterol, or kidney disease. Stress. Having a family history of high blood pressure and high cholesterol. Having obstructive sleep apnea. Age. The risk increases with age. What are the signs or symptoms? High blood pressure may not cause symptoms. Very high blood pressure (hypertensive crisis) may cause: Headache. Fast or irregular heartbeats (palpitations). Shortness of breath. Nosebleed. Nausea and vomiting. Vision changes. Severe chest pain, dizziness, and seizures. How is this diagnosed? This condition is diagnosed by measuring your blood pressure while you are seated, with your arm resting on a flat surface, your legs uncrossed, and your feet flat on the floor. The cuff of the blood pressure monitor will be placed directly against the skin of your upper arm at the level of your heart. Blood pressure should be measured at least twice using the same arm. Certain conditions can cause a difference in blood pressure between your right and left arms. If you have a high blood pressure reading during one visit or you have normal blood pressure with other risk factors, you may be asked to: Return on a different day to have your blood pressure checked again. Monitor your blood pressure at home for 1 week or longer. If you are diagnosed with hypertension, you may have other blood or imaging tests to help your health care provider understand your overall risk for other conditions. How is this treated? This condition is treated by making healthy lifestyle changes, such as eating healthy foods, exercising more, and reducing your alcohol intake. You may be referred for counseling on a healthy diet and physical activity. Your health care provider may prescribe medicine if lifestyle changes are not enough to get your blood pressure under control and if: Your systolic blood pressure is above 130. Your diastolic blood pressure is above  80. Your personal target blood pressure may vary depending on your medical conditions, your age, and other factors. Follow these instructions at home: Eating and drinking  Eat a diet that is high in fiber and potassium, and low in sodium, added sugar, and fat. An example of this eating plan is called the DASH diet. DASH stands for Dietary Approaches to Stop Hypertension. To eat this way: Eat plenty of fresh fruits and vegetables. Try to fill one half of your plate at each meal with fruits and vegetables. Eat whole grains, such as whole-wheat pasta, brown rice, or whole-grain bread. Fill about one fourth of your plate with whole grains. Eat or drink low-fat dairy products, such as skim milk or low-fat yogurt. Avoid fatty cuts of meat, processed or cured meats, and poultry with skin. Fill about one fourth of your plate with lean proteins, such as fish, chicken without skin, beans, eggs, or tofu. Avoid pre-made and processed foods. These tend to be higher in sodium, added sugar, and fat. Reduce your daily sodium intake. Many people with hypertension should eat less than 1,500 mg of sodium a day. Do not drink alcohol if: Your health care provider tells you not to drink. You are pregnant, may be pregnant, or are planning to become pregnant. If you drink alcohol: Limit how much you have to: 0-1 drink a day for women. 0-2 drinks a day for men. Know how much alcohol is in your drink. In the [place] one drink equals one 12 oz bottle of beer (355 mL), one 5 oz glass of wine (148 mL), or one 1  oz glass of hard liquor (44 mL). Lifestyle  Work with your health care provider to maintain a healthy body weight or to lose weight. Ask what an ideal weight is for you. Get at least 30 minutes of exercise that causes your heart to beat faster (aerobic exercise) most days of the week. Activities may include walking, swimming, or biking. Include exercise to strengthen your muscles (resistance exercise), such as  Pilates or lifting weights, as part of your weekly exercise routine. Try to do these types of exercises for 30 minutes at least 3 days a week. Do not use any products that contain nicotine or tobacco. These products include cigarettes, chewing tobacco, and vaping devices, such as e-cigarettes. If you need help quitting, ask your health care provider. Monitor your blood pressure at home as told by your health care provider. Keep all follow-up visits. This is important. Medicines Take over-the-counter and prescription medicines only as told by your health care provider. Follow directions carefully. Blood pressure medicines must be taken as prescribed. Do not skip doses of blood pressure medicine. Doing this puts you at risk for problems and can make the medicine less effective. Ask your health care provider about side effects or reactions to medicines that you should watch for. Contact a health care provider if you: Think you are having a reaction to a medicine you are taking. Have headaches that keep coming back (recurring). Feel dizzy. Have swelling in your ankles. Have trouble with your vision. Get help right away if you: Develop a severe headache or confusion. Have unusual weakness or numbness. Feel faint. Have severe pain in your chest or abdomen. Vomit repeatedly. Have trouble breathing. These symptoms may be an emergency. Get help right away. Call 911. Do not wait to see if the symptoms will go away. Do not drive yourself to the hospital. Summary Hypertension is when the force of blood pumping through your arteries is too strong. If this condition is not controlled, it may put you at risk for serious complications. Your personal target blood pressure may vary depending on your medical conditions, your age, and other factors. For most people, a normal blood pressure is less than 120/80. Hypertension is treated with lifestyle changes, medicines, or a combination of both. Lifestyle  changes include losing weight, eating a healthy, low-sodium diet, exercising more, and limiting alcohol. This information is not intended to replace advice given to you by your health care provider. Make sure you discuss any questions you have with your health care provider. Document Revised: 08/22/2021 Document Reviewed: 08/22/2021 Elsevier Patient Education  2024 Elsevier Inc[employer]     Emil Schaumann, MD [facility]

## 2024-05-27 NOTE — Assessment & Plan Note (Signed)
Diet and nutrition discussed Benefits of exercise discussed Continue rosuvastatin 10 mg daily

## 2024-05-27 NOTE — Assessment & Plan Note (Signed)
 Good weight.  Continue rosuvastatin  10 mg daily. Diet and nutrition discussed as well as importance of physical exercise. Follow-up in 6 months.

## 2024-05-27 NOTE — Assessment & Plan Note (Signed)
 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 Recommend vascular surgery evaluation Has appointment in 2 weeks. ED precautions given We will follow-up after vascular surgery evaluation

## 2024-05-27 NOTE — Patient Instructions (Signed)
 Hypertension, Adult High blood pressure (hypertension) is when the force of blood pumping through the arteries is too strong. The arteries are the blood vessels that carry blood from the heart throughout the body. Hypertension forces the heart to work harder to pump blood and may cause arteries to become narrow or stiff. Untreated or uncontrolled hypertension can lead to a heart attack, heart failure, a stroke, kidney disease, and other problems. A blood pressure reading consists of a higher number over a lower number. Ideally, your blood pressure should be below 120/80. The first ("top") number is called the systolic pressure. It is a measure of the pressure in your arteries as your heart beats. The second ("bottom") number is called the diastolic pressure. It is a measure of the pressure in your arteries as the heart relaxes. What are the causes? The exact cause of this condition is not known. There are some conditions that result in high blood pressure. What increases the risk? Certain factors may make you more likely to develop high blood pressure. Some of these risk factors are under your control, including: Smoking. Not getting enough exercise or physical activity. Being overweight. Having too much fat, sugar, calories, or salt (sodium) in your diet. Drinking too much alcohol. Other risk factors include: Having a personal history of heart disease, diabetes, high cholesterol, or kidney disease. Stress. Having a family history of high blood pressure and high cholesterol. Having obstructive sleep apnea. Age. The risk increases with age. What are the signs or symptoms? High blood pressure may not cause symptoms. Very high blood pressure (hypertensive crisis) may cause: Headache. Fast or irregular heartbeats (palpitations). Shortness of breath. Nosebleed. Nausea and vomiting. Vision changes. Severe chest pain, dizziness, and seizures. How is this diagnosed? This condition is diagnosed by  measuring your blood pressure while you are seated, with your arm resting on a flat surface, your legs uncrossed, and your feet flat on the floor. The cuff of the blood pressure monitor will be placed directly against the skin of your upper arm at the level of your heart. Blood pressure should be measured at least twice using the same arm. Certain conditions can cause a difference in blood pressure between your right and left arms. If you have a high blood pressure reading during one visit or you have normal blood pressure with other risk factors, you may be asked to: Return on a different day to have your blood pressure checked again. Monitor your blood pressure at home for 1 week or longer. If you are diagnosed with hypertension, you may have other blood or imaging tests to help your health care provider understand your overall risk for other conditions. How is this treated? This condition is treated by making healthy lifestyle changes, such as eating healthy foods, exercising more, and reducing your alcohol intake. You may be referred for counseling on a healthy diet and physical activity. Your health care provider may prescribe medicine if lifestyle changes are not enough to get your blood pressure under control and if: Your systolic blood pressure is above 130. Your diastolic blood pressure is above 80. Your personal target blood pressure may vary depending on your medical conditions, your age, and other factors. Follow these instructions at home: Eating and drinking  Eat a diet that is high in fiber and potassium, and low in sodium, added sugar, and fat. An example of this eating plan is called the DASH diet. DASH stands for Dietary Approaches to Stop Hypertension. To eat this way: Eat  plenty of fresh fruits and vegetables. Try to fill one half of your plate at each meal with fruits and vegetables. Eat whole grains, such as whole-wheat pasta, brown rice, or whole-grain bread. Fill about one  fourth of your plate with whole grains. Eat or drink low-fat dairy products, such as skim milk or low-fat yogurt. Avoid fatty cuts of meat, processed or cured meats, and poultry with skin. Fill about one fourth of your plate with lean proteins, such as fish, chicken without skin, beans, eggs, or tofu. Avoid pre-made and processed foods. These tend to be higher in sodium, added sugar, and fat. Reduce your daily sodium intake. Many people with hypertension should eat less than 1,500 mg of sodium a day. Do not drink alcohol if: Your health care provider tells you not to drink. You are pregnant, may be pregnant, or are planning to become pregnant. If you drink alcohol: Limit how much you have to: 0-1 drink a day for women. 0-2 drinks a day for men. Know how much alcohol is in your drink. In the [place] one drink equals one 12 oz bottle of beer (355 mL), one 5 oz glass of wine (148 mL), or one 1 oz glass of hard liquor (44 mL). Lifestyle  Work with your health care provider to maintain a healthy body weight or to lose weight. Ask what an ideal weight is for you. Get at least 30 minutes of exercise that causes your heart to beat faster (aerobic exercise) most days of the week. Activities may include walking, swimming, or biking. Include exercise to strengthen your muscles (resistance exercise), such as Pilates or lifting weights, as part of your weekly exercise routine. Try to do these types of exercises for 30 minutes at least 3 days a week. Do not use any products that contain nicotine or tobacco. These products include cigarettes, chewing tobacco, and vaping devices, such as e-cigarettes. If you need help quitting, ask your health care provider. Monitor your blood pressure at home as told by your health care provider. Keep all follow-up visits. This is important. Medicines Take over-the-counter and prescription medicines only as told by your health care provider. Follow directions carefully. Blood  pressure medicines must be taken as prescribed. Do not skip doses of blood pressure medicine. Doing this puts you at risk for problems and can make the medicine less effective. Ask your health care provider about side effects or reactions to medicines that you should watch for. Contact a health care provider if you: Think you are having a reaction to a medicine you are taking. Have headaches that keep coming back (recurring). Feel dizzy. Have swelling in your ankles. Have trouble with your vision. Get help right away if you: Develop a severe headache or confusion. Have unusual weakness or numbness. Feel faint. Have severe pain in your chest or abdomen. Vomit repeatedly. Have trouble breathing. These symptoms may be an emergency. Get help right away. Call 911. Do not wait to see if the symptoms will go away. Do not drive yourself to the hospital. Summary Hypertension is when the force of blood pumping through your arteries is too strong. If this condition is not controlled, it may put you at risk for serious complications. Your personal target blood pressure may vary depending on your medical conditions, your age, and other factors. For most people, a normal blood pressure is less than 120/80. Hypertension is treated with lifestyle changes, medicines, or a combination of both. Lifestyle changes include losing weight, eating a healthy,  low-sodium diet, exercising more, and limiting alcohol. This information is not intended to replace advice given to you by your health care provider. Make sure you discuss any questions you have with your health care provider. Document Revised: 08/22/2021 Document Reviewed: 08/22/2021 Elsevier Patient Education  2024 [employer].

## 2024-05-28 ENCOUNTER — Other Ambulatory Visit: Payer: Self-pay | Admitting: Vascular Surgery

## 2024-05-28 DIAGNOSIS — M7989 Other specified soft tissue disorders: Secondary | ICD-10-CM

## 2024-06-22 NOTE — Progress Notes (Unsigned)
 [facility] [place]  ASSESSMENT / PLAN: 68 y.o. female with bilateral lower extremity skin lesions. No evidence of chronic venous insufficiency on duplex testing today. Suspect lesions are dermatologic. She can follow up with me as needed.  CHIEF COMPLAINT: skin lesions  HISTORY OF PRESENT ILLNESS: Jacqueline Thompson is a 68 y.o. female with bilateral lower extremity skin lesions. She was referred to our office for evaluation of possible chronic venous insufficiency. She has lesions about her super malleolar and pretibial lesions that are red and very itchy. Her duplex testing was unremarkable, and we shared this with her in detail.   Past Medical History:  Diagnosis Date   Allergy    Hypertension     Past Surgical History:  Procedure Laterality Date   ABDOMINAL HYSTERECTOMY  2007   Birth Mark Removed from Face Left     Family History  Problem Relation Age of Onset   Heart disease Mother    Cancer Father        colon,prostate and spine   Stroke Brother    Colon cancer Neg Hx    Esophageal cancer Neg Hx    Pancreatic cancer Neg Hx    Rectal cancer Neg Hx    Stomach cancer Neg Hx    Breast cancer Neg Hx     Social History   Socioeconomic History   Marital status: Legally Separated    Spouse name: Not on file   Number of children: 0   Years of education: Not on file   Highest education level: Master's degree (e.g., MA, MS, MEng, MEd, MSW, MBA)  Occupational History   Occupation: distrubution specialist  Tobacco Use   Smoking status: Former    Current packs/day: 0.00    Average packs/day: 0.8 packs/day for 10.1 years (7.6 ttl pk-yrs)    Types: Cigarettes    Start date: 31    Quit date: 11/29/1997    Years since quitting: 26.5   Smokeless tobacco: Never   Tobacco comments:    smoked a pack per week  Vaping Use   Vaping status: Never Used  Substance and Sexual Activity   Alcohol use: Yes    Alcohol/week: 4.0 standard drinks of  alcohol    Types: 3 Glasses of wine, 1 Shots of liquor per week    Comment: 2 drinks a week   Drug use: No   Sexual activity: Yes    Partners: Male    Birth control/protection: None  Other Topics Concern   Not on file  Social History Narrative   Marital status: married   Children: none   Lives with: husband           Seatbelt: 100%   Guns in home: no         Exercise - active at work   Sleep - good   Social Drivers of Corporate investment banker Strain: Low Risk  (05/23/2024)   Overall Financial Resource Strain (CARDIA)    Difficulty of Paying Living Expenses: Not very hard  Food Insecurity: No Food Insecurity (05/23/2024)   Hunger Vital Sign    Worried About Running Out of Food in the Last Year: Never true    Ran Out of Food in the Last Year: Never true  Transportation Needs: No Transportation Needs (05/23/2024)   PRAPARE - Administrator, Civil Service (Medical): No    Lack of Transportation (Non-Medical): No  Physical Activity: Insufficiently Active (05/23/2024)   Exercise Vital Sign  Days of Exercise per Week: 4 days    Minutes of Exercise per Session: 30 min  Stress: No Stress Concern Present (05/23/2024)   [employer] of Occupational Health - Occupational Stress Questionnaire    Feeling of Stress: Not at all  Social Connections: Moderately Integrated (05/23/2024)   Social Connection and Isolation Panel    Frequency of Communication with Friends and Family: Three times a week    Frequency of Social Gatherings with Friends and Family: Three times a week    Attends Religious Services: 1 to 4 times per year    Active Member of Clubs or Organizations: No    Attends Engineer, structural: Not on file    Marital Status: Married  Catering manager Violence: Not on file    No Known Allergies  Current Outpatient Medications  Medication Sig Dispense Refill   albuterol  (VENTOLIN  HFA) 108 (90 Base) MCG/ACT inhaler Inhale 2 puffs into the lungs  every 4 (four) hours as needed for wheezing or shortness of breath (cough, shortness of breath or wheezing.). 18 g 5   amLODipine  (NORVASC ) 10 MG tablet TAKE 1 TABLET BY MOUTH EVERY DAY 30 tablet 11   fluticasone  (FLONASE ) 50 MCG/ACT nasal spray Place 2 sprays into both nostrils daily. 48 g 1   rosuvastatin  (CRESTOR ) 10 MG tablet TAKE 1 TABLET BY MOUTH EVERY DAY 90 tablet 3   valsartan -hydrochlorothiazide  (DIOVAN -HCT) 320-12.5 MG tablet Take 1 tablet by mouth daily. 90 tablet 3   No current facility-administered medications for this visit.    PHYSICAL EXAM Vitals:   06/23/24 1516  BP: (!) 172/87  Pulse: 73  Temp: 97.9 F (36.6 C)  SpO2: 94%  Weight: 174 lb (78.9 kg)  Height: 5' 5 (1.651 m)    Well appearing elderly woman in no distress Regular rate and rhythm Unlabored breathing 2+ DP pulse  PERTINENT LABORATORY AND RADIOLOGIC DATA  Most recent CBC    Latest Ref Rng & Units 03/17/2024    1:38 PM 11/27/2023    3:10 PM 01/08/2019    4:28 PM  CBC  WBC 4.0 - 10.5 K/uL 7.5  9.5  7.8   Hemoglobin 12.0 - 15.0 g/dL 84.5  82.3  85.0   Hematocrit 36.0 - 46.0 % 46.0  52.9  44.0   Platelets 150.0 - 400.0 K/uL 251.0  276.0  293      Most recent CMP    Latest Ref Rng & Units 03/17/2024    1:38 PM 11/27/2023    3:10 PM 02/20/2021   10:56 AM  CMP  Glucose 70 - 99 mg/dL 99  95  91   BUN 6 - 23 mg/dL 21  20  22    Creatinine 0.40 - 1.20 mg/dL 9.06  8.94  8.69   Sodium 135 - 145 mEq/L 143  141  145   Potassium 3.5 - 5.1 mEq/L 3.3  3.2  3.6   Chloride 96 - 112 mEq/L 105  101  107   CO2 19 - 32 mEq/L 26  32  31   Calcium  8.4 - 10.5 mg/dL 9.7  9.9  9.8   Total Protein 6.0 - 8.3 g/dL 7.9  8.4  7.3   Total Bilirubin 0.2 - 1.2 mg/dL 0.6  0.5  0.6   Alkaline Phos 39 - 117 U/L 69  64  60   AST 0 - 37 U/L 23  26  17    ALT 0 - 35 U/L 21  31  13  Hgb A1c MFr Bld (%)  Date Value  11/27/2023 5.9    LDL Chol Calc (NIH)  Date Value Ref Range Status  01/14/2020 75 0 - 99 mg/dL Final    LDL Cholesterol  Date Value Ref Range Status  11/27/2023 93 0 - 99 mg/dL Final    Debby SAILOR. Magda, MD FACS [facility] Phone Number: [phone] 06/22/2024 8:30 PM   Total time spent on preparing this encounter including chart review, data review, collecting history, examining the patient, and coordinating care: 30 minutes.  Portions of this report may have been transcribed using voice recognition software.  Every effort has been made to ensure accuracy; however, inadvertent computerized transcription errors may still be present.

## 2024-06-23 ENCOUNTER — Ambulatory Visit (HOSPITAL_COMMUNITY): Admission: RE | Admit: 2024-06-23 | Source: Ambulatory Visit

## 2024-06-23 ENCOUNTER — Ambulatory Visit: Attending: Vascular Surgery | Admitting: Vascular Surgery

## 2024-06-23 ENCOUNTER — Ambulatory Visit (HOSPITAL_BASED_OUTPATIENT_CLINIC_OR_DEPARTMENT_OTHER)

## 2024-06-23 ENCOUNTER — Encounter: Payer: Self-pay | Admitting: Vascular Surgery

## 2024-06-23 VITALS — BP 172/87 | HR 73 | Temp 97.9°F | Ht 65.0 in | Wt 174.0 lb

## 2024-06-23 DIAGNOSIS — R21 Rash and other nonspecific skin eruption: Secondary | ICD-10-CM | POA: Diagnosis not present

## 2024-06-23 DIAGNOSIS — M7989 Other specified soft tissue disorders: Secondary | ICD-10-CM | POA: Diagnosis not present

## 2024-08-05 ENCOUNTER — Ambulatory Visit: Payer: BC Managed Care – PPO | Admitting: Emergency Medicine

## 2024-08-11 ENCOUNTER — Encounter: Payer: Self-pay | Admitting: Dermatology

## 2024-08-11 ENCOUNTER — Ambulatory Visit: Admitting: Dermatology

## 2024-08-11 VITALS — BP 160/96 | HR 78

## 2024-08-11 DIAGNOSIS — L299 Pruritus, unspecified: Secondary | ICD-10-CM

## 2024-08-11 DIAGNOSIS — Z79899 Other long term (current) drug therapy: Secondary | ICD-10-CM | POA: Diagnosis not present

## 2024-08-11 DIAGNOSIS — L409 Psoriasis, unspecified: Secondary | ICD-10-CM

## 2024-08-11 MED ORDER — CALCIPOTRIENE 0.005 % EX CREA: TOPICAL_CREAM | Freq: Two times a day (BID) | CUTANEOUS | 6 refills | Status: AC

## 2024-08-11 MED ORDER — OTEZLA 30 MG PO TABS: 30.0000 mg | ORAL_TABLET | Freq: Two times a day (BID) | ORAL | 11 refills | Status: AC

## 2024-08-11 MED ORDER — CLOBETASOL PROPIONATE 0.05 % EX OINT: 1.0000 | TOPICAL_OINTMENT | Freq: Two times a day (BID) | CUTANEOUS | 6 refills | Status: AC

## 2024-08-11 NOTE — Patient Instructions (Addendum)
 Date: August 11, 2024  Jacqueline Thompson,  Thank you for visiting us  today. We appreciate your commitment to improving your skin health. Here is a summary of the key instructions from today's consultation:  - Prescription Medications:   - Clobetasol: Apply twice daily for 2 weeks, then switch to calcipotriene for 2 weeks. Keep alternating between these two medications until your next visit   - Otezla (apremilast): Take samples as provided for 6 weeks. Start with a low dose and gradually increase to 30 mg twice daily   - Imodium: Take if you get diarrhea from the Otezla  - What to Expect:   - The pink color will fade first, then the rough feeling will get better   - Brown spots may take longer to go away   - Further treatment for dark spots can be discussed once the inflammation is under control  - Follow-Up: We will see you again in 6 weeks to assess your progress and make any necessary adjustments to your treatment plan.  We look forward to seeing improvement at your next visit. If you have any questions or concerns before then, please do not hesitate to contact our office through MyChart or by phone.  Warm regards,  Dr. Delon Lenis, Dermatology    Important Information   Due to recent changes in healthcare laws, you may see results of your pathology and/or laboratory studies on MyChart before the doctors have had a chance to review them. We understand that in some cases there may be results that are confusing or concerning to you. Please understand that not all results are received at the same time and often the doctors may need to interpret multiple results in order to provide you with the best plan of care or course of treatment. Therefore, we ask that you please give us  2 business days to thoroughly review all your results before contacting the office for clarification. Should we see a critical lab result, you will be contacted sooner.     If You Need Anything After Your Visit    If you have any questions or concerns for your doctor, please call our main line at [phone]. If no one answers, please leave a voicemail as directed and we will return your call as soon as possible. Messages left after 4 pm will be answered the following business day.    You may also send us  a message via MyChart. We typically respond to MyChart messages within 1-2 business days.  For prescription refills, please ask your pharmacy to contact our office. Our fax number is [phone].  If you have an urgent issue when the clinic is closed that cannot wait until the next business day, you can page your doctor at the number below.     Please note that while we do our best to be available for urgent issues outside of office hours, we are not available 24/7.    If you have an urgent issue and are unable to reach us , you may choose to seek medical care at your doctor's office, retail clinic, urgent care center, or emergency room.   If you have a medical emergency, please immediately call 911 or go to the emergency department. In the event of inclement weather, please call our main line at [phone] for an update on the status of any delays or closures.  Dermatology Medication Tips: Please keep the boxes that topical medications come in in order to help keep track of the instructions about where and  how to use these. Pharmacies typically print the medication instructions only on the boxes and not directly on the medication tubes.   If your medication is too expensive, please contact our office at [phone] or send us  a message through MyChart.    We are unable to tell what your co-pay for medications will be in advance as this is different depending on your insurance coverage. However, we may be able to find a substitute medication at lower cost or fill out paperwork to get insurance to cover a needed medication.    If a prior authorization is required to get your medication covered by  your insurance company, please allow us  1-2 business days to complete this process.   Drug prices often vary depending on where the prescription is filled and some pharmacies may offer cheaper prices.   The website www.goodrx.com contains coupons for medications through different pharmacies. The prices here do not account for what the cost may be with help from insurance (it may be cheaper with your insurance), but the website can give you the price if you did not use any insurance.  - You can print the associated coupon and take it with your prescription to the pharmacy.  - You may also stop by our office during regular business hours and pick up a GoodRx coupon card.  - If you need your prescription sent electronically to a different pharmacy, notify our office through [facility] or by phone at [phone]

## 2024-08-11 NOTE — Progress Notes (Unsigned)
   New Patient Visit   Subjective  Jacqueline Thompson is a 68 y.o. female who presents for the following: Skin irration  Patient states she has skin irritation located at the lower legs that she would like to have examined. Patient reports the areas have been there for 9 months. She reports the areas are bothersome. She reports the areas are itchy, burning and painful. Patient rates irritation 10 out of 10. She states that the areas have spread. Patient reports she has previously been treated for these areas. Patient denies Hx of bx. Patient states she is unsure of what caused the irritation. She was prescribed prednisone  and Duricef anti-biotics. She has also tried [facility].  The following portions of the chart were reviewed this encounter and updated as appropriate: medications, allergies, medical history  Review of Systems:  No other skin or systemic complaints except as noted in HPI or Assessment and Plan.  Objective  Well appearing patient in no apparent distress; mood and affect are within normal limits.  A focused examination was performed of the following areas: BLE, Gluteal Crease and Arms  Relevant exam findings are noted in the Assessment and Plan.                               Assessment & Plan   PSORIASIS Exam: Well-demarcated erythematous papules/plaques with silvery scale, guttate pink scaly papules. 12% BSA IGA: 3 Itch Scale: 10 out of 10  Flared  Patient denies joint pain  Psoriasis is a chronic non-curable, but treatable genetic/hereditary disease that may have other systemic features affecting other organ systems such as joints (Psoriatic Arthritis). It is associated with an increased risk of inflammatory bowel disease, heart disease, non-alcoholic fatty liver disease, and depression.  Treatments include light and laser treatments; topical medications; and systemic medications including oral and injectables.  Treatment Plan: - Prescribed Clobetasol  0.05% Ointment to apply 2 times daily for 2 weeks  - Prescribed Calcipotriene to apply 2 times daily for 2 weeks - Provided Samples of Otezla starter pack - Plan to follow up in 6 months  Pt is not a candidate for methotrexate or cyclosporine due to inability for follow up labs and contraindications with other medications. Pt has no access to a light box for phototherapy.  Due to the progressive and chronic nature of her psoriasis, patient has tried and failed numerous topicals creams as noted above, the next best therapeutic option is a systemic therapy. It is medically necessary to help improve her quality of life.   PSORIASIS   Related Medications clobetasol ointment (TEMOVATE) 0.05 % Apply 1 Application topically 2 (two) times daily. Apply 2 times daily for 2 weeks then STOP and take a break for 2 weeks calcipotriene (DOVONOX) 0.005 % cream Apply topically 2 (two) times daily. Apply 2 times daily for 2 weeks then STOP and take a break for 2 weeks Apremilast (OTEZLA) 30 MG TABS Take 1 tablet (30 mg total) by mouth 2 (two) times daily.  Return in about 6 months (around 02/09/2025) for Psoriasis F/U.  I, Jetta Ager, am acting as Neurosurgeon for [employer], DO.  Documentation: I have reviewed the above documentation for accuracy and completeness, and I agree with the above.  Delon Lenis, DO

## 2024-08-17 ENCOUNTER — Ambulatory Visit: Admitting: Dermatology

## 2024-11-30 ENCOUNTER — Ambulatory Visit: Admitting: Emergency Medicine

## 2024-12-01 ENCOUNTER — Ambulatory Visit: Admitting: Emergency Medicine

## 2024-12-08 ENCOUNTER — Ambulatory Visit: Admitting: Emergency Medicine

## 2025-02-10 ENCOUNTER — Ambulatory Visit: Admitting: Dermatology
